# Patient Record
Sex: Female | Born: 1970 | Race: Black or African American | Hispanic: No | Marital: Married | Smoking: Never smoker
Health system: Southern US, Community
[De-identification: ages and names within clinical notes are randomized; demographics above are authoritative.]

## PROBLEM LIST (undated history)

## (undated) ENCOUNTER — Emergency Department (HOSPITAL_BASED_OUTPATIENT_CLINIC_OR_DEPARTMENT_OTHER): Admission: EM | Payer: BC Managed Care – PPO

## (undated) DIAGNOSIS — I10 Essential (primary) hypertension: Secondary | ICD-10-CM

## (undated) DIAGNOSIS — E119 Type 2 diabetes mellitus without complications: Secondary | ICD-10-CM

## (undated) HISTORY — DX: Type 2 diabetes mellitus without complications: E11.9

## (undated) HISTORY — PX: TUMOR REMOVAL: SHX12

## (undated) HISTORY — PX: AUGMENTATION MAMMAPLASTY: SUR837

## (undated) HISTORY — PX: ABDOMINAL HYSTERECTOMY: SHX81

## (undated) HISTORY — DX: Essential (primary) hypertension: I10

---

## 1998-01-18 ENCOUNTER — Emergency Department (HOSPITAL_COMMUNITY): Admission: EM | Admit: 1998-01-18 | Discharge: 1998-01-18 | Payer: Self-pay | Admitting: Emergency Medicine

## 1998-09-02 ENCOUNTER — Emergency Department (HOSPITAL_COMMUNITY): Admission: EM | Admit: 1998-09-02 | Discharge: 1998-09-02 | Payer: Self-pay | Admitting: Emergency Medicine

## 1999-02-02 ENCOUNTER — Emergency Department (HOSPITAL_COMMUNITY): Admission: EM | Admit: 1999-02-02 | Discharge: 1999-02-03 | Payer: Self-pay | Admitting: Emergency Medicine

## 1999-12-15 ENCOUNTER — Emergency Department (HOSPITAL_COMMUNITY): Admission: EM | Admit: 1999-12-15 | Discharge: 1999-12-15 | Payer: Self-pay | Admitting: Emergency Medicine

## 2000-02-22 ENCOUNTER — Inpatient Hospital Stay (HOSPITAL_COMMUNITY): Admission: AD | Admit: 2000-02-22 | Discharge: 2000-02-22 | Payer: Self-pay | Admitting: *Deleted

## 2000-05-29 ENCOUNTER — Other Ambulatory Visit: Admission: RE | Admit: 2000-05-29 | Discharge: 2000-05-29 | Payer: Self-pay | Admitting: *Deleted

## 2000-05-29 ENCOUNTER — Encounter (INDEPENDENT_AMBULATORY_CARE_PROVIDER_SITE_OTHER): Payer: Self-pay

## 2000-06-30 ENCOUNTER — Emergency Department (HOSPITAL_COMMUNITY): Admission: EM | Admit: 2000-06-30 | Discharge: 2000-06-30 | Payer: Self-pay | Admitting: Emergency Medicine

## 2000-07-18 ENCOUNTER — Emergency Department (HOSPITAL_COMMUNITY): Admission: EM | Admit: 2000-07-18 | Discharge: 2000-07-18 | Payer: Self-pay | Admitting: Emergency Medicine

## 2000-10-13 ENCOUNTER — Other Ambulatory Visit: Admission: RE | Admit: 2000-10-13 | Discharge: 2000-10-13 | Payer: Self-pay | Admitting: Obstetrics and Gynecology

## 2000-10-29 ENCOUNTER — Encounter (INDEPENDENT_AMBULATORY_CARE_PROVIDER_SITE_OTHER): Payer: Self-pay

## 2000-10-29 ENCOUNTER — Ambulatory Visit (HOSPITAL_COMMUNITY): Admission: RE | Admit: 2000-10-29 | Discharge: 2000-10-29 | Payer: Self-pay | Admitting: Obstetrics and Gynecology

## 2001-01-13 ENCOUNTER — Other Ambulatory Visit: Admission: RE | Admit: 2001-01-13 | Discharge: 2001-01-13 | Payer: Self-pay | Admitting: Obstetrics and Gynecology

## 2001-02-18 ENCOUNTER — Ambulatory Visit: Admission: RE | Admit: 2001-02-18 | Discharge: 2001-02-18 | Payer: Self-pay | Admitting: Gynecologic Oncology

## 2001-04-01 ENCOUNTER — Ambulatory Visit (HOSPITAL_COMMUNITY): Admission: RE | Admit: 2001-04-01 | Discharge: 2001-04-01 | Payer: Self-pay | Admitting: Gynecology

## 2001-04-01 ENCOUNTER — Encounter (INDEPENDENT_AMBULATORY_CARE_PROVIDER_SITE_OTHER): Payer: Self-pay | Admitting: Specialist

## 2001-04-01 ENCOUNTER — Encounter: Payer: Self-pay | Admitting: Gynecology

## 2001-04-22 ENCOUNTER — Ambulatory Visit (HOSPITAL_COMMUNITY): Admission: RE | Admit: 2001-04-22 | Discharge: 2001-04-22 | Payer: Self-pay | Admitting: Obstetrics and Gynecology

## 2001-04-22 ENCOUNTER — Encounter (INDEPENDENT_AMBULATORY_CARE_PROVIDER_SITE_OTHER): Payer: Self-pay

## 2001-05-06 ENCOUNTER — Ambulatory Visit: Admission: RE | Admit: 2001-05-06 | Discharge: 2001-05-06 | Payer: Self-pay | Admitting: Gynecology

## 2001-06-02 ENCOUNTER — Ambulatory Visit: Admission: RE | Admit: 2001-06-02 | Discharge: 2001-06-02 | Payer: Self-pay | Admitting: Gynecology

## 2001-06-16 ENCOUNTER — Encounter (INDEPENDENT_AMBULATORY_CARE_PROVIDER_SITE_OTHER): Payer: Self-pay

## 2001-06-16 ENCOUNTER — Ambulatory Visit (HOSPITAL_COMMUNITY): Admission: RE | Admit: 2001-06-16 | Discharge: 2001-06-16 | Payer: Self-pay | Admitting: Gynecology

## 2001-07-28 ENCOUNTER — Ambulatory Visit: Admission: RE | Admit: 2001-07-28 | Discharge: 2001-07-28 | Payer: Self-pay | Admitting: Gynecologic Oncology

## 2001-10-21 ENCOUNTER — Ambulatory Visit: Admission: RE | Admit: 2001-10-21 | Discharge: 2001-10-21 | Payer: Self-pay | Admitting: Gynecology

## 2001-10-21 ENCOUNTER — Other Ambulatory Visit: Admission: RE | Admit: 2001-10-21 | Discharge: 2001-10-21 | Payer: Self-pay | Admitting: Gynecology

## 2001-10-21 ENCOUNTER — Encounter (INDEPENDENT_AMBULATORY_CARE_PROVIDER_SITE_OTHER): Payer: Self-pay | Admitting: *Deleted

## 2001-11-26 ENCOUNTER — Inpatient Hospital Stay (HOSPITAL_COMMUNITY): Admission: AD | Admit: 2001-11-26 | Discharge: 2001-11-26 | Payer: Self-pay | Admitting: Obstetrics and Gynecology

## 2002-04-30 ENCOUNTER — Ambulatory Visit (HOSPITAL_COMMUNITY): Admission: RE | Admit: 2002-04-30 | Discharge: 2002-04-30 | Payer: Self-pay | Admitting: Obstetrics & Gynecology

## 2002-04-30 ENCOUNTER — Encounter: Payer: Self-pay | Admitting: Obstetrics & Gynecology

## 2002-07-13 ENCOUNTER — Inpatient Hospital Stay (HOSPITAL_COMMUNITY): Admission: AD | Admit: 2002-07-13 | Discharge: 2002-07-16 | Payer: Self-pay | Admitting: Obstetrics & Gynecology

## 2003-01-29 HISTORY — PX: AUGMENTATION MAMMAPLASTY: SUR837

## 2003-09-08 ENCOUNTER — Inpatient Hospital Stay (HOSPITAL_COMMUNITY): Admission: AD | Admit: 2003-09-08 | Discharge: 2003-09-11 | Payer: Self-pay | Admitting: Obstetrics & Gynecology

## 2003-09-09 ENCOUNTER — Encounter (INDEPENDENT_AMBULATORY_CARE_PROVIDER_SITE_OTHER): Payer: Self-pay | Admitting: Specialist

## 2003-11-30 ENCOUNTER — Inpatient Hospital Stay (HOSPITAL_COMMUNITY): Admission: RE | Admit: 2003-11-30 | Discharge: 2003-12-03 | Payer: Self-pay | Admitting: Obstetrics & Gynecology

## 2003-11-30 ENCOUNTER — Encounter (INDEPENDENT_AMBULATORY_CARE_PROVIDER_SITE_OTHER): Payer: Self-pay | Admitting: *Deleted

## 2004-04-22 ENCOUNTER — Emergency Department (HOSPITAL_COMMUNITY): Admission: EM | Admit: 2004-04-22 | Discharge: 2004-04-22 | Payer: Self-pay | Admitting: Emergency Medicine

## 2004-10-29 ENCOUNTER — Emergency Department (HOSPITAL_COMMUNITY): Admission: EM | Admit: 2004-10-29 | Discharge: 2004-10-29 | Payer: Self-pay

## 2005-06-10 ENCOUNTER — Emergency Department (HOSPITAL_COMMUNITY): Admission: EM | Admit: 2005-06-10 | Discharge: 2005-06-10 | Payer: Self-pay | Admitting: Emergency Medicine

## 2005-08-05 ENCOUNTER — Emergency Department (HOSPITAL_COMMUNITY): Admission: EM | Admit: 2005-08-05 | Discharge: 2005-08-06 | Payer: Self-pay | Admitting: Emergency Medicine

## 2005-08-30 ENCOUNTER — Emergency Department (HOSPITAL_COMMUNITY): Admission: EM | Admit: 2005-08-30 | Discharge: 2005-08-30 | Payer: Self-pay | Admitting: Family Medicine

## 2006-02-01 ENCOUNTER — Inpatient Hospital Stay (HOSPITAL_COMMUNITY): Admission: EM | Admit: 2006-02-01 | Discharge: 2006-02-05 | Payer: Self-pay | Admitting: Emergency Medicine

## 2006-02-08 ENCOUNTER — Emergency Department (HOSPITAL_COMMUNITY): Admission: EM | Admit: 2006-02-08 | Discharge: 2006-02-08 | Payer: Self-pay | Admitting: Emergency Medicine

## 2006-04-08 ENCOUNTER — Emergency Department (HOSPITAL_COMMUNITY): Admission: EM | Admit: 2006-04-08 | Discharge: 2006-04-08 | Payer: Self-pay | Admitting: Emergency Medicine

## 2007-11-03 ENCOUNTER — Other Ambulatory Visit: Admission: RE | Admit: 2007-11-03 | Discharge: 2007-11-03 | Payer: Self-pay | Admitting: Obstetrics and Gynecology

## 2007-12-01 ENCOUNTER — Other Ambulatory Visit: Admission: RE | Admit: 2007-12-01 | Discharge: 2007-12-01 | Payer: Self-pay | Admitting: Obstetrics and Gynecology

## 2008-01-14 ENCOUNTER — Emergency Department (HOSPITAL_COMMUNITY): Admission: EM | Admit: 2008-01-14 | Discharge: 2008-01-14 | Payer: Self-pay | Admitting: Emergency Medicine

## 2009-01-17 ENCOUNTER — Other Ambulatory Visit: Admission: RE | Admit: 2009-01-17 | Discharge: 2009-01-17 | Payer: Self-pay | Admitting: Obstetrics and Gynecology

## 2009-03-24 ENCOUNTER — Encounter: Admission: RE | Admit: 2009-03-24 | Discharge: 2009-03-24 | Payer: Self-pay | Admitting: Internal Medicine

## 2010-02-15 ENCOUNTER — Emergency Department (HOSPITAL_COMMUNITY)
Admission: EM | Admit: 2010-02-15 | Discharge: 2010-02-15 | Payer: Self-pay | Source: Home / Self Care | Admitting: Emergency Medicine

## 2010-03-05 ENCOUNTER — Emergency Department (HOSPITAL_COMMUNITY)
Admission: EM | Admit: 2010-03-05 | Discharge: 2010-03-05 | Disposition: A | Discharge status: Home / Self Care | Payer: No Typology Code available for payment source | Attending: Emergency Medicine | Admitting: Emergency Medicine

## 2010-03-05 ENCOUNTER — Emergency Department (HOSPITAL_COMMUNITY): Payer: No Typology Code available for payment source

## 2010-03-05 DIAGNOSIS — T148XXA Other injury of unspecified body region, initial encounter: Secondary | ICD-10-CM | POA: Insufficient documentation

## 2010-03-05 DIAGNOSIS — S335XXA Sprain of ligaments of lumbar spine, initial encounter: Secondary | ICD-10-CM | POA: Insufficient documentation

## 2010-05-04 ENCOUNTER — Emergency Department (HOSPITAL_COMMUNITY)
Admission: EM | Admit: 2010-05-04 | Discharge: 2010-05-04 | Disposition: A | Discharge status: Home / Self Care | Payer: BC Managed Care – PPO | Attending: Emergency Medicine | Admitting: Emergency Medicine

## 2010-05-04 DIAGNOSIS — R599 Enlarged lymph nodes, unspecified: Secondary | ICD-10-CM | POA: Insufficient documentation

## 2010-05-04 DIAGNOSIS — J020 Streptococcal pharyngitis: Secondary | ICD-10-CM | POA: Insufficient documentation

## 2010-05-04 LAB — RAPID STREP SCREEN (MED CTR MEBANE ONLY): Streptococcus, Group A Screen (Direct): POSITIVE — AB

## 2010-06-15 NOTE — H&P (Signed)
NAMEMarland Kitchen  Kara Hoover, Kara Hoover            ACCOUNT NO.:  [id number]   MEDICAL RECORD NO.:  [id number]          PATIENT TYPE:  INP   LOCATION:  0102                         FACILITY:  [facility]   PHYSICIAN:  Hettie Holstein, D.O.    DATE OF BIRTH:  1970/05/11   DATE OF ADMISSION:  01/31/2006  DATE OF DISCHARGE:                              HISTORY & PHYSICAL   PRIMARY CARE PHYSICIAN:  Unassigned.   CHIEF COMPLAINT:  Fever and chills.   HISTORY OF PRESENT ILLNESS:  Kara Hoover is a pleasant 40 year old  African-American female with a relatively unremarkable past medical  history who had developed cold symptoms for approximately 2 weeks. She  had been treating this like a cold; however, the morning of January 31, 2005 she noticed a temperature of 101.2 at her mother-in-law's.  Subsequently she had a recheck and it was 102. She developed some chills  and subsequently presented to the emergency department. She was  evaluated by Dr. Weldon Inches and underwent chest x-ray which was  concerning for possible abnormal finding and this was further evaluated  by CT scan which was reported as right lower lobe pneumonia (round  pneumonia). She was hypotensive, she had 1 liter of fluid and continued  to remain a little hypotensive and a little tachycardic. She is being  admitted for further treatment. She has undergone blood cultures x2  already.   PAST MEDICAL HISTORY:  Unremarkable. She has undergone a hysterectomy as  well as a conization biopsy for cervical cancer that she said had been  treated.   MEDICATIONS ON A ROUTINE BASIS:  None.   ALLERGIES:  No known drug allergies.   SOCIAL HISTORY:  She is currently employed at [employer]. She smokes less  than 1 pack of cigarettes per week. She is married and has 4 children.  Her husband, Mr. Susann Hoover, can be reached at [phone].   FAMILY HISTORY:  Noncontributory. Both of her parents are alive and well  at 74 and 62 respectively. She did have a  grandparent with prostate  cancer.   REVIEW OF SYSTEMS:  Unremarkable with the exception of the above. She  has had no nausea or vomiting or diarrhea, only shortness of breath,  fevers, chills, increased thirst otherwise further review is  unremarkable.   PHYSICAL EXAMINATION:  VITAL SIGNS:  Blood pressure 73/35, heart rate  113, respirations 16, O2 saturation 99%, temperature 99.1. She does have  a nasal cannula applied.  GENERAL:  The patient is alert, conversing. She does appear a bit  exhausted though she is not in any acute respiratory distress though her  oxygenation reveals borderline reads.  HEENT:  Reveals her head to be normocephalic, atraumatic. Extraocular  muscles intact.  NECK:  Supple, nontender, no palpable thyromegaly or mass.  CARDIOVASCULAR:  Reveals normal S1, S2 though slightly tachycardic.  LUNGS:  Reveal diminished breath sounds at the right base. There is no  overt dullness to percussion.  ABDOMEN:  Soft and nontender, no rebound or guarding.  EXTREMITIES:  Lower extremities reveal no edema.   LABORATORY DATA:  Sodium 140, potassium 3.6, BUN 13, creatinine 1.3,  glucose 106. WBC 11, hemoglobin 13.3, platelet count was 65.   CT scan as noted above revealed a right lower lobe pneumonia.   ASSESSMENT:  1. Right lower lobe pneumonia.  2. Systemic inflammatory response.  3. Dehydration.   PLAN:  We are going to admit Ms. Franklin to Step-Down ICU. Continue  aggressive IV fluids and follow clinical course. She has undergone blood  cultures and we will treat her empirically with Rocephin and Zithromax  and cover atypical infections. Will follow her clinical course and  administer pressors if necessary and required. Will follow up on basic  metabolic panel in the morning as well as a CBC and obtain urine  cultures as well.      Hettie Holstein, D.O.  Electronically Signed     ESS/MEDQ  D:  02/01/2006  T:  02/01/2006  Job:  [id number]

## 2010-06-15 NOTE — Op Note (Signed)
Kara Hoover, Kara Hoover           ACCOUNT NO.:  [id number]   MEDICAL RECORD NO.:  [id number]          PATIENT TYPE:  INP   LOCATION:  9309                          FACILITY:  [facility]   PHYSICIAN:  Roseanna Rainbow, M.D.DATE OF BIRTH:  [birth year]   DATE OF PROCEDURE:  11/30/2003  DATE OF DISCHARGE:                                 OPERATIVE REPORT   PREOPERATIVE DIAGNOSIS:  Recurrent cervical dysplasia.   POSTOPERATIVE DIAGNOSIS:  Recurrent cervical dysplasia.   PROCEDURE:  Total vaginal hysterectomy and repair of a cystotomy.   SURGEONS:  1.  Roseanna Rainbow, M.D.  2.  Charles A. Clearance Coots, M.D.   INTRAOPERATIVE CONSULTATION:  Excell Seltzer. Annabell Howells, M.D.   ANESTHESIA:  General endotracheal.   COMPLICATIONS:  Cystotomy.   ESTIMATED BLOOD LOSS:  100 cc.   FLUIDS:  As per anesthesiology.   FINDINGS:  Small anteverted uterus.  Normal tubes and ovaries.   PROCEDURE:  The patient was taken to the operating room with an IV running.  The patient was placed in the dorsal lithotomy position, prepped and draped  in the usual sterile fashion.  A weighted speculum was then placed in the  vagina and the cervix grasped with a Jacobs tenaculum.  The cervix was  injected circumferentially with 1% lidocaine with 1:200,000 of epinephrine.  The cervix was then circumferentially incised and the bladder dissected off  the pubocervical fascia anteriorly with Metzenbaum scissors.  The anterior  cul-de-sac was entered sharply.  The same procedure was performed  posteriorly and the posterior cul-de-sac entered sharply without difficulty.  At this point, parametrial clamps were placed over the uterosacral ligaments  on either side.  These were then transected and suture ligated with 0  Vicryl.  Hemostasis was ensured.  The cardinal ligaments were then clamped  on both sides, transected, and suture ligated in a similar fashion.  The  uterine arteries and broad ligaments were then serial clamped with  parametrial clamps, transected, and suture ligated on both sides.  Excellent  hemostasis was visualized.  Both cornu were clamped with parametrial clamps,  transected, and the uterus delivered.  These pedicles were secured with free  ligatures and suture ligatures.  The vaginal cuff angles were closed with  figure-of-eight sutures of 0 Vicryl.  A modified McCall culdoplasty was then  performed using 0 Vicryl.  The remainder of the vaginal cuff was closed with  figure-of-eight sutures of 0 Vicryl in an interrupted fashion.  At this  point, the Foley catheter was introduced into the bladder, and very minimal  urine was noted, and the urine that drained was blood-tinged.  At this  point, cystoscopy was performed, and a cystotomy was noted in the bladder  base below the trigone.  The right ureteral orifice was visualized.  The  left ureteral orifice was not well visualized.  At this point, the vaginal  cuff sutures were taken down.  The cystotomy was circumscribed with Allis  clamps.  The bladder laceration was then reapproximated in a running fashion  with 2-0 Monocryl.  A second imbricating layer was placed.  The closure was  tested by instilling  sterile milk into the bladder.  There was no leakage of  the sterile milk noted.  At this point, Dr. Annabell Howells was consulted.  Please see  his dictated intraoperative consultation note.  The vaginal cuff was then  reapproximated with interrupted figure-of-eight sutures.  The Foley was then  placed, and copious blue urine was noted to drain.  All instruments were  removed from the vagina and the patient taken out of dorsal lithotomy  position and awakened from general anesthesia.  The patient was taken to the  PACU in stable condition.  Sponge, lap, needle, and instrument counts were  correct x 2.   PATHOLOGY:  Uterus and cervix.     Collier Flowers  D:  12/01/2003  T:  12/02/2003  Job:  [id number]   cc:   Excell Seltzer. Annabell Howells, M.D.  [address]  [address]  [place]  [place] [address]  Fax: [phone]

## 2010-06-15 NOTE — Op Note (Signed)
[facility]  Patient:    Kara Hoover, Kara Hoover Visit Number: [id number] MRN: [id number]          Service Type: DSU Location: DAY Attending Physician:  Jeannette Corpus Dictated by:   Rudy Jew Ashley Royalty, M.D. Admit Date:  04/01/2001 Discharge Date: 04/01/2001                             Operative Report  PREOPERATIVE DIAGNOSES:       1. 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                               2. History of cervical stenosis - alleviated by                                  fractional D&C per Dr. Serita Kyle on                                  April 01, 2001.  At the time of that                                  procedure, an endocervical curettage was                                  performed which revealed fragments of highly                                  dysplastic endothelium.                               3. Desire for continued childbearing capability.  POSTOPERATIVE DIAGNOSES:      1. 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                               2. History of cervical stenosis - alleviated by  fractional D&C per Dr. Serita Kyle on                                  April 01, 2001.  At the time of that                                  procedure, an endocervical curettage was                                  performed which revealed fragments of highly                                  dysplastic endothelium.                               3. Desire for continued childbearing capability.                               4. Pathology pending.  PROCEDURE:                    Repeat cold knife  conization.  SURGEON:                      Rudy Jew. Ashley Royalty, M.D.  ANESTHESIA:                   General.  ESTIMATED BLOOD LOSS:         50 cc.  COMPLICATIONS:                None.  PACKS AND DRAINS:             None.  DESCRIPTION OF PROCEDURE:     The patient was taken to the operating room and placed in the dorsal supine position.  After general anesthesia was administered, she was placed in the lithotomy position, prepped and draped in the usual manner for vaginal surgery with Hibiclens.  Anterior and posterior retractors were placed.  The cervix was grasped with a single-tooth tenaculum at 3 and 9 oclock.  Hemostatic sutures of 2-0 chromic were placed in interrupted fashion in these locations.  Next, a lacrimal duct probe was used to verify the patency of the cervix and establish the correct axis for the cold knife conization.  A cold knife conization was performed with a #11 scalpel without difficulty.  A tall, skinny cone was felt to be indicated and thus performed.  The apex of the specimen was excised with Mayo scissors.  The specimen was marked at 12 oclock with a suture and submitted to pathology in saline for histologic studies.  Next, endocervical curettage was performed with the Kevorkian curet.  The curettings were submitted to pathology separately for histologic studies.  Next, the conization bed was coagulated with the Bovie.  Hemostasis was noted. The vaginal instruments were removed.  Hemostasis was once again noted and the procedure terminated.  The patient tolerated the procedure extremely well and was returned to the recovery room in good condition. Dictated by:   Rudy Jew Ashley Royalty, M.D. Attending Physician:  Jeannette Corpus  DD:  04/22/01 TD:  04/22/01 Job: [id number]

## 2010-06-15 NOTE — Op Note (Signed)
[facility]  Patient:    Kara Hoover, Kara Hoover Visit Number: [id number] MRN: [id number]          Service Type: DSU Location: [facility] Attending Physician:  Wandalee Ferdinand Dictated by:   Rudy Jew Ashley Royalty, M.D. Admit Date:  10/29/2000 Discharge Date: 10/29/2000                             Operative Report  PREOPERATIVE DIAGNOSES:       1. Recent Pap smear revealing "squamous cell                                  carcinoma."                               2. Recent colposcopy revealing negative                                  colposcopic directed biopsies and                                  endocervical curettage revealing high grade                                  squamous dysplasia with invasion not ruled                                  out.  POSTOPERATIVE DIAGNOSES:      1. Recent Pap smear revealing "squamous cell                                  carcinoma."                               2. Recent colposcopy revealing negative                                  colposcopic directed biopsies and                                  endocervical curettage revealing high grade                                  squamous dysplasia with invasion not ruled                                  out.  PATHOLOGY:                    Pending.  PROCEDURE:                    Cold knife conization.  SURGEON:  James A. Ashley Royalty, M.D.  ANESTHESIA:                   General.  ESTIMATED BLOOD LOSS:         50 cc.  COMPLICATIONS:                None.  PACKS AND DRAINS:             None.  PROCEDURE:                    Patient was taken to the operating room and placed in the dorsal supine position.  After adequate general anesthesia was administered she was placed in the lithotomy position and prepped and draped in the usual manner for vaginal surgery.  Posterior weighted retractor was placed per vagina.  The cervix was grasped at 3 and 9  oclock with single tooth tenaculum.  Hemostatic sutures were placed at 3 and 9 oclock with 0 Chromic.  Next, the cervix was incised in a conical fashion with a #11 blade. At the apex of the cone the cone was excised with Mayo scissors.  An incision was made at 11 oclock in the surgical specimen and the specimen pinned out on cork and submitted directly to pathology in saline.  Endocervical curettage was then performed with Kevorkian curette.  Tissue was submitted separately to pathology for histologic studies.  Four hemostatic sutures were placed 0 Chromic.  An outside-in, inside-out technique was used to secure each quadrant hemostatically.  Hemostasis was noted.  The procedure was terminated.  Patient tolerated procedure extremely well and was returned to the recovery room in good condition. Dictated by:   Rudy Jew Ashley Royalty, M.D. Attending Physician:  Wandalee Ferdinand DD:  10/31/00 TD:  11/02/00 Job: [id number]

## 2010-06-15 NOTE — Discharge Summary (Signed)
NAME:  Kara Hoover, Kara Hoover           ACCOUNT NO.:  [id number]   MEDICAL RECORD NO.:  [id number]          PATIENT TYPE:  INP   LOCATION:  1407                         FACILITY:  [facility]   PHYSICIAN:  Marcellus Scott, MD     DATE OF BIRTH:  09-11-1970   DATE OF ADMISSION:  01/31/2006  DATE OF DISCHARGE:  02/05/2006                               DISCHARGE SUMMARY   PRIMARY CARE PHYSICIAN:  Patient was unassigned when the patient was  admitted to the [facility]; however, patient on  discharge will be followed up by Dr. Lonia Blood.   DISCHARGE DIAGNOSES:  1. Right lower lobe community-acquired pneumococcal pneumonia.  2. Pneumococcal bacteremia.  3. Anemia.  4. Hypotension.  5. Tobacco abuse.   DISCHARGE MEDICATIONS:  1. Levaquin 500 mg p.o. daily x10 days.  2. Robitussin-DM 5 ml q.6h. p.r.n.  3. Nicotine transdermal 21 mg per 24-hour patch 1 daily for six weeks,      then to be tapered as an outpatient.   PROCEDURES DONE IN THE HOSPITAL:  1. On February 02, 2006, portal abdominal x-ray.  Impression is mild      colonic and small bowel distention.  2. On February 02, 2006, a chest x-ray, portable.  Impression:  Stable      right effusion and right lower lobe pneumonia.  New bibasilar      interstitial edema.  3. On February 01, 2006, portable chest x-ray.  Impression:  Worsening      right lower lobe consolidation/atelectasis with effusion.  4. On February 01, 2006, CT of the chest with contrast.  Impression:      Right lower lobe air space disease with response to the      radiographic anatomy, compatible with pneumonia.  5. On January 31, 2006, chest x-ray.  Impression is there is a large      opacity in the region of the superior segment of the right lower      lobe.  No definite air bronchograms are seen.  The medial border is      obscured.  Primary consideration would include posterior      mediastinal mass, pleural-based mass, or less likely pulmonary  parenchymal abnormality.  CT chest with contrast is warranted.   CONSULTATIONS:  None.   HOSPITAL COURSE/CONDITION OF PATIENT ON DISCHARGE:  For details of the  initial admission, please refer to the history and physical note that  was done by Dr. Hannah Beat on January 31, 2006.  In summary, [name] is a pleasant 40 year old African-American female with an  unremarkable past medical history, presented with a history of cold-like  symptoms for two weeks.  On the morning of the day of admission, she was  noted to have a high grade fever, and further evaluation revealed a  right lower lobe pneumonia.  Patient was also found to be hypotensive,  which was easily resuscitated with IV fluids.  Patient was admitted for  evaluation and management.   1. Right lower lobe pneumonia:  Patient was admitted to the step-down      unit.  Patient was  hydrated aggressively with intravenous fluids.      She was placed on intravenous ceftriaxone and Zithromax.  She also      received oxygen and Robitussin-DM.  Blood cultures were sent off,      which subsequently revealed Streptococcus pneumoniae, which was      sensitive to ceftriaxone, levofloxacin, intermediately sensitive to      penicillin.  The patient has progressively done well.  Was      transferred to the floor.  Currently, the patient is asymptomatic      of any fever, chills, or rigors.  She still has some cough with      minimal brown sputum, some dyspnea on exertion, but saturating well      without oxygen.  Feeding appropriately.  Patient was switched to      levofloxacin on the 8th of January.  She is to complete a total two      week course of antibiotics.  She is stable for discharge, to be      followed up as an outpatient by the primary care physician.  She      has been advised to seek attention immediately in the case of any      deterioration.   1. Pneumococcal bacteremia, possibly secondary to her pneumococcal      right  lower lobe pneumonia.  The patient has done well, was briefly      hypotensive in the emergency room but responded well to IV fluids      and was initially continued on ceftriaxone and Zithromax but      subsequently changed to Levaquin after obtaining the final culture      results, and she is to complete a two week course of antibiotics.      Pneumococcal vaccination to be considered as an outpatient.   1. Anemia, which is normocytic anemia:  The workup included a serum      iron of less than 10, vitamin B12 of 584, folate 7.7, ferritin of      237.  Patient has questionable iron-deficiency anemia or anemia of      chronic disease, and this has to be evaluated and managed as deemed      appropriate as an outpatient.   1. Hypotension:  This was a presentation on admission which resolved      with IV fluid hydration.  Patient did not require any pressors.      Patient's random cortisol level was 26.8, and this is probably      secondary to her sepsis secondary to right lower lobe pneumonia.   1. Tobacco use:  Patient has received tobacco cessation counseling.      She has also been placed on a nicotine transdermal patch, which has      to be tapered as an outpatient.   1. Coagulase negative staph urinary tract infection:  Patient is      sensitive to LEVOFLOXACIN, so that should cover her presumed      urinary tract infection as well.      Marcellus Scott, MD  Electronically Signed     AH/MEDQ  D:  02/05/2006  T:  02/05/2006  Job:  [id number]   cc:   Lonia Blood, M.D.

## 2010-06-15 NOTE — H&P (Signed)
NAME:  Kara Hoover, Kara Hoover           ACCOUNT NO.:  [id number]   MEDICAL RECORD NO.:  [id number]          PATIENT TYPE:  INP   LOCATION:  NA                            FACILITY:  [facility]   PHYSICIAN:  Roseanna Rainbow, M.D.DATE OF BIRTH:  1970/10/17   DATE OF ADMISSION:  DATE OF DISCHARGE:                                HISTORY & PHYSICAL   CHIEF COMPLAINT:  The patient is a 40 year old multipara with a history of  carcinoma in situ of the cervix, recurrent, who presents for a total vaginal  hysterectomy.   HISTORY OF PRESENT ILLNESS:  The patient had undergone a LEEP conization in  March 2003 for high-grade cervical dysplasia.  This was persistent with  subsequent abnormal Pap smears.  She then underwent a cold knife conization  in May 2003.  Subsequently, she developed cervical stenosis.  She was then  evaluated by Dr. Stanford Breed who then again performed a cold knife  conization.  Subsequent Pap smears in September 2003 were normal as well as  in July 2004.  However, a Pap smear from September 2005 demonstrated ASCUS,  cannot exclude high-grade squamous intraepithelial lesion.  An attempt was  made at colposcopic exam in the office.  However, the exam was  unsatisfactory.  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  The  biopsy demonstrated benign squamous mucosa with hyperkeratosis.  The  endocervical curettage was rare fragments of benign squamous mucosa.  High-  risk HPV testing was performed and the high risk types were not detected.   PAST OBSTETRICAL AND GYNECOLOGICAL HISTORY:  She is status post four NSVDs.   PAST MEDICAL HISTORY:  She denies.   PAST SURGICAL HISTORY:  See above.  She is status post a bilateral tubal  ligation.   FAMILY HISTORY:  Remarkable for cervical cancer.   SOCIAL HISTORY:  She is married.  She denies any tobacco, ethanol, or  substance abuse.   MEDICATIONS:  None.   ALLERGIES:  No known drug allergies.   PHYSICAL EXAMINATION:   VITAL SIGNS:  Blood pressure 131/80, pulse 80,  temperature 98.7, weight 155 pounds.  GENERAL:  Well-developed, well-nourished, no apparent distress.  LUNGS:  Clear to auscultation bilaterally.  HEART:  Regular rate and rhythm.  ABDOMEN:  No organomegaly, not tender.  PELVIC:  Normal EG/BUS.  On speculum exam, the vagina is clean.  On bimanual  exam, the uterus is anteverted, normal size, nontender.  The adnexa are  nonpalpable, no organomegaly, nontender bilaterally.  EXTREMITIES:  No clubbing, cyanosis, or edema.  SKIN:  Without rash.   ASSESSMENT:  History of recurrent high-grade cervical dysplasia.  Childbearing is completed.  Difficult follow-up secondary to repeat  conizations.   PLAN:  Total vaginal hysterectomy.  The risks, benefits, and alternative  forms of management were reviewed with the patient and informed consent was  obtained.      LAJ/MEDQ  D:  11/29/2003  T:  11/29/2003  Job:  [id number]

## 2010-06-15 NOTE — Consult Note (Signed)
   NAME:  Kara Hoover, Kara Hoover                     ACCOUNT NO.:  [id number]   MEDICAL RECORD NO.:  [id number]                   PATIENT TYPE:  OUT   LOCATION:  GYN                                  FACILITY:  [facility]   PHYSICIAN:  Rande Brunt. Clarke-Pearson, M.D.      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  She underwent a cold knife conization Jun 16, 2001.  She returned to see her primary gynecologist, Dr. Ashley Royalty,  recently but apparently there was great difficulty in obtaining a Pap smear.   The patient denies any gynecologic symptoms.  She has regular cyclic  menstrual periods.  She has no intramenstrual spotting.  She denies any  dyspareunia or other pelvic pain.  She remains very strongly desires of  achieving pregnancy.   PHYSICAL EXAMINATION:  VITAL SIGNS:  Weight 161 pounds.  GENERAL:  The patient is a healthy black female in no acute distress.  ABDOMEN:  Soft, nontender.  No mass, organomegaly, ascites, or hernias are  noted.  PELVIC:  EGBUS, vagina, bladder, urethra are normal.  The cervix is deviated  posteriorly, is flush with the vaginal vault, and has a narrow cervical os.  Pap smears are obtained, but with some difficulty.  Uterus is anterior,  normal shape, size, consistency.  The cervix palpates normally with no  nodularity or firmness.   IMPRESSION:  Past history of carcinoma in situ of the cervix status post  cold knife conization May 2003 with negative margins.   PLAN:  Pap smear is repeated today.   The patient had a number of questions regarding fertility which I deferred  to Dr. Ashley Royalty.  She is strongly desires of achieving fertility and I  encouraged her to discuss a fertility evaluation with Dr. Ashley Royalty.  She  should have a repeat Pap smear in four  months.                                               Daniel L. Stanford Breed, M.D.    DLC/MEDQ  D:  10/21/2001  T:  10/21/2001  Job:  [id number]   cc:   Fayrene Fearing A. Ashley Royalty, M.D.  [address]  [place], [place] [address]  Fax: [phone]   Telford Nab, R.N.

## 2010-06-15 NOTE — Consult Note (Signed)
[facility]  Patient:    Kara Hoover, Kara Hoover Visit Number: [id number] MRN: [id number]          Service Type: GON Location: GYN Attending Physician:  Jeannette Corpus Dictated by:   Rande Brunt. Clarke-Pearson, M.D. Admit Date:  02/18/2001   CC:         Fayrene Fearing A. Ashley Royalty, M.D.  Telford Nab, R.N.   Consultation Report  HISTORY OF PRESENT ILLNESS:  A 40 year old black female referred by Dr. 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  The patient, over the past year or so, has had abnormal Pap smears and ultimately underwent a cold knife conization on October 29, 2000.  Final pathology showed carcinoma in situ with extensive endocervical glandular extension and extension into the endocervical margins in multiple quadrants.  The endocervical curettage above the specimen showed multiple detached fragments of dysplastic squamous mucosa.  Subsequently, it has been found that the patient has cervical stenosis.  She does report that she has regular cyclic menses.  She has no cramping or dysmenorrhea or dyspareunia.  She is desirous of maintaining her fertility.  She does have two living children and would like to have a third by her new husband.  PAST SURGICAL HISTORY:  Cold knife conization.  DRUG ALLERGIES:  None.  CURRENT MEDICATIONS:  None.  MEDICAL ILLNESSES:  None.  REVIEW OF SYSTEMS:  Essentially negative.  PHYSICAL EXAMINATION:  VITAL SIGNS:  Weight 152 pounds, blood pressure 106/66.  GENERAL:  The patient is a healthy African American female in no acute distress.  HEENT:  Negative.  NECK:  Supple without thyromegaly.  LYMPH NODES:  There is no supraclavicular or inguinal adenopathy.  ABDOMEN:  Soft and nontender.  No masses, organomegaly, ascites, or hernias are noted.  PELVIC:  EGBUS normal.  The vagina is clean.  The cervix is apparent but the cervical os is not  apparent.  This appears to be very stenotic.  Attempts at sounding the cervix using a wire probe are unsuccessful.  Colposcopic examination is then performed using acetic acid and no lesions are noted on the external cervix, although again I am not able to identify the cervical os.  IMPRESSION:  Cervical stenosis in a patient who has had cold knife conization showing carcinoma in situ with positive margins.  Management options are discussed with the patient.  I think our options are limited to attempting to redilate the cervix under anesthesia or perform a hysterectomy.  The patient strongly desires attempting to dilate the cervix and we will therefore schedule her to undergo D&C with ultrasound guidance while she is having her menstrual period so that we may find the cervical os.  This will be scheduled for March 5 in the outpatient surgery center.  We will ask that ultrasound be available for that procedure. Dictated by:   Rande Brunt. Clarke-Pearson, M.D. Attending Physician:  Jeannette Corpus DD:  02/18/01 TD:  02/19/01 Job: [id number]

## 2010-06-15 NOTE — Op Note (Signed)
NAME:  Kara Hoover, Kara Hoover                     ACCOUNT NO.:  [id number]   MEDICAL RECORD NO.:  [id number]                   PATIENT TYPE:  INP   LOCATION:  9140                                 FACILITY:  [facility]   PHYSICIAN:  Charles A. Clearance Coots, M.D.             DATE OF BIRTH:  December 24, 1970   DATE OF PROCEDURE:  09/09/2003  DATE OF DISCHARGE:                                 OPERATIVE REPORT   PREOPERATIVE DIAGNOSES:  Desires sterilization.   POSTOPERATIVE DIAGNOSES:  Desires sterilization.   PROCEDURE:  Bilateral partial salpingectomy.   SURGEON:  Charles A. Clearance Coots, M.D.   ANESTHESIA:  Epidural.   ESTIMATED BLOOD LOSS:  Negligible.   COMPLICATIONS:  None.   SPECIMENS:  Approximately 2 cm segments of left and right fallopian tubes.   OPERATION:  The patient was brought to the operating room and after  satisfactory redosing of the epidural, the abdomen was prepped and draped in  the usual sterile fashion. A small inferior umbilical incision was made with  the scalpel that was deep and down to the fascia bluntly with curved Mayo  scissors. The fascia was grasped in the midline with Kelly forceps and was  cut transversely with curved Mayo scissors. The peritoneum was then entered  bluntly, right angled retractors were placed in the incision. The right  fallopian tube and was grasped with a Babcock clamp. The tube was followed  from the corneal end to the fimbrial end serially and grasped with Babcock  clamps and then followed retrograde back to the isthmic area of the tube.  The knuckle of tube beneath the Babcock clamp in the isthmic area of the  tube was then doubly ligated with #1 plain catgut and the section of the  tube above the knot was excised with Metzenbaum scissors and submitted to  pathology for evaluation.  There is no active bleeding from the tubal stump.  The same procedure is performed on the opposite side without complications.  The abdomen was then closed as follows.   The peritoneum and fascia was  closed as one with a continuous suture of 2-0 Vicryl. The subcutaneous  tissue was approximated with interrupted sutures of 2-0 Vicryl. The skin was  closed with continuous subcuticular suture of 4-0 Monocryl.  Sterile  bandages applied to the incision closure.  The surgical technician indicated  all sponge, needle and instrument counts were correct.  The patient  tolerated the procedure well and was transported to the recovery room in  satisfactory condition.                                               Charles A. Clearance Coots, M.D.    CAH/MEDQ  D:  09/09/2003  T:  09/09/2003  Job:  [id number]

## 2010-06-15 NOTE — Op Note (Signed)
NAMEMAIZY, DAVANZO           ACCOUNT NO.:  [id number]   MEDICAL RECORD NO.:  [id number]          PATIENT TYPE:  INP   LOCATION:  9399                          FACILITY:  [facility]   PHYSICIAN:  Excell Seltzer. Annabell Howells, M.D.    DATE OF BIRTH:  1970-08-21   DATE OF PROCEDURE:  11/30/2003  DATE OF DISCHARGE:                                 OPERATIVE REPORT   Briefly, Ms. Susann Givens is a 40 year old female who was undergoing a routine  vaginal hysterectomy. She had had two prior cervical conizations and  following completion of the procedure she was noted to have a bladder tear.  This was repaired primarily by Dr. Tamela Oddi but she wanted urologic  consultation to ensure that no additional work was necessary.   PROCEDURE:  Cystoscopy with bilateral ureteral catheterization.   SURGEON:  Excell Seltzer. Annabell Howells, M.D.   ANESTHESIA:  General.   COMPLICATIONS:  None.   INDICATIONS FOR PROCEDURE:  As above.   DESCRIPTION OF PROCEDURE:  The patient was in the lithotomy position. A 22  French cystoscope was inserted with a 12 degree lens. Examination revealed a  normal urethra, the bladder wall was smooth and pale. There was a defect  repair in the floor of the bladder between the ureteral orifices.  Monocryl  sutures from the repair were evident within the bladder lumen.  The ureteral  orifices were retracted somewhat medially so in order to ensure patency I  ordered an ampule of indigo carmine and then attempted ureteral  catheterization. A 5 French open end catheter passed approximately 1-2 cm on  the right but would not progress further. The same was noted on the left. A  Glidewire was then passed up the right ureteral orifice without difficulty.  The course of the ureter beneath the mucosa was well away from the area of  the repair.  On the left, I was unable to pass the guidewire more because of  the angulation of the orifice but she was noted to efflux blue urine on both  sides with good force and it  was felt that the ureters were not compromised.  Additionally the bladder repair seemed intact without leakage. The bladder  was then drained, the scope was removed and Dr. Tamela Oddi completed her  vaginal closure. I did encourage her to try to avoid an overlapping suture  line.  There were no complications during my portion of the procedure.      JJW/MEDQ  D:  11/30/2003  T:  11/30/2003  Job:  [id number]   cc:   Roseanna Rainbow, M.D.

## 2010-06-15 NOTE — Consult Note (Signed)
[facility]  Patient:    Kara Hoover, Kara Hoover Visit Number: [id number] MRN: [id number]          Service Type: GON Location: GYN Attending Physician:  Sabino Donovan Dictated by:   Jackquline Denmark. Kyla Balzarine, M.D. Proc. Date: 07/28/01 Admit Date:  07/28/2001 Discharge Date: 07/28/2001   CC:         Kara Hoover, M.D.  Telford Nab, R.N.   Consultation Report  REASON FOR CONSULTATION:  Kara Hoover is a 40 year old woman who returns for followup after undergoing cervical conization on 06/16/01.  The patient relates that she has not yet had a period.  She did have daily spotting and occasionally passed clots, but all bleeding has diminished since mid June.  She denies fever or chills.  She has had intercourse without difficulty.  PHYSICAL EXAMINATION:  VITAL SIGNS:  Weight 161 pounds, blood pressure 102/70.  ABDOMEN:  Soft and benign.  PELVIC:  External genitalia and BUS are normal to inspection and palpation. The bladder and urethra are well supported.  Vaginal mucosa is without lesions.  The cervix is scared post conization and will admit a small probe through the internal os.  Bimanual examination reveals small uterus without adnexal pathology.  LABORATORY DATA:  Final diagnosis on cervical conization was squamous carcinoma in situ with free margins.  Endocervical curettings revealed benign degenerating proliferative endometrium.  ASSESSMENT:  CIN 3 post conization.  PLAN:  I recommend that the patient be followed with cervical cytology at four month intervals for the first year after conization, and then at six month intervals to complete at least three to five years of followup.  She is interested in pursuing pregnancy, and this followup could be obtained under the ______ of Dr. Ashley Hoover.  We would be glad to see her back at any time on a p.r.n. basis. Dictated by:   Jackquline Denmark. Kyla Balzarine, M.D. Attending Physician:  Ronita Hipps T DD:   07/28/01 TD:  07/30/01 Job: [id number]

## 2010-06-15 NOTE — Op Note (Signed)
[facility]  Patient:    Kara Hoover, Kara Hoover Visit Number: [id number] MRN: [id number]          Service Type: DSU Location: DAY Attending Physician:  Jeannette Corpus Dictated by:   Rande Brunt. Clarke-Pearson, M.D. Proc. Date: 04/01/01 Admit Date:  04/01/2001   CC:         Fayrene Fearing A. Ashley Royalty, M.D.  Telford Nab, R.N.   Operative Report  PREOPERATIVE DIAGNOSES:  Carcinoma in situ of the cervix, cervical stenosis.  POSTOPERATIVE DIAGNOSES:  Carcinoma in situ of the cervix, cervical stenosis.  PROCEDURE:  Examination under anesthesia, dilation of the cervix, endocervical curettage.  SURGEON:  Daniel L. Clarke-Pearson, M.D.  ASSISTANT:  Telford Nab, R.N.  ANESTHESIA:  General with mask.  ESTIMATED BLOOD LOSS:  10 cc.  SURGICAL FINDINGS:  Examination under anesthesia revealed a normal-appearing cervix, although the cervical os was stenotic and flush with the upper vagina. The uterus is normal size.  With ultrasound visualization, we were able to enter the endometrial cavity safely and dilated the cervix to a #32 Pratt dilator.  DESCRIPTION OF PROCEDURE:  The patient was brought to the operating room and after satisfactory attainment of general anesthesia, was placed in lithotomy position in candy-cane stirrups.  The perineum and vagina were prepped with Betadine and the patient draped.  A Foley catheter was placed, and the bladder was filled with 200 cc of saline.  A transabdominal ultrasound was performed, identifying the uterus.  The cervix was grasped with a single-tooth tenaculum. Using a wire probe, the cervical os was identified and then sounded with visualization by ultrasound, confirming that we were in the canal and entered the endometrial cavity.  Gradually, the cervix was dilated with the [facility] dilators, again under ultrasound visualization.  Once we had achieved maximal dilation, the endocervical canal was curetted and  the specimen submitted to pathology.  Tenaculum was removed.  The patient was awakened from anesthesia and taken to the recovery room in satisfactory condition.  Sponge, needle, and instrument counts correct x 2. Dictated by:   Rande Brunt. Clarke-Pearson, M.D. Attending Physician:  Jeannette Corpus DD:  04/01/01 TD:  04/01/01 Job: [id number]

## 2010-06-15 NOTE — Op Note (Signed)
[facility]  Patient:    Kara Hoover, Kara Hoover Visit Number: [id number] MRN: [id number]          Service Type: DSU Location: DAY Attending Physician:  Jeannette Corpus Dictated by:   Rande Brunt. Clarke-Pearson, M.D. Proc. Date: 06/16/01 Admit Date:  06/16/2001   CC:         Fayrene Fearing A. Ashley Royalty, M.D.  Telford Nab, R.N.   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  POSTOPERATIVE DIAGNOSIS:  Severe dysplasia in endocervix.  PROCEDURE:  Cold knife conization and endocervical curettage.  SURGEON:  Daniel L. Clarke-Pearson, M.D.  ASSISTANT:  Telford Nab, R.N.  ANESTHESIA:  General with mask.  ESTIMATED BLOOD LOSS:  5 cc.  SURGICAL FINDINGS:  Examination under anesthesia revealed a cervix which previously had undergone prior conization and LEEP procedures. The endocervical canal was patent. The uterus sounded to approximately 6 cm posteriorly.  DESCRIPTION OF PROCEDURE:  The patient was brought to the operating room and after satisfactory attainment of general anesthesia, was placed in lithotomy position in candy-cane stirrups. The perineum and vagina were prepped with Betadine and the patient was draped. The cervix was grasped with a single-tooth tenaculum. The cervix was injected with 10 cc of 1% Xylocaine with epinephrine for hemostasis. Two sutures were placed to incorporate the cervical branch of the uterine artery at 5 and 9 oclock on the lateral cervix. These were tied for hemostasis. A long conization was then performed aiming to remove the entire endocervical canal as she had known disease high above a prior conization and LEEP procedures. The entire length of the canal, at the completion of the surgical procedure, was approximately 3.5 cm. The cervix was marked at 80 oclock with a silk suture. Endocervical curettage was then performed and submitted as a separate specimen. Hemostasis was  achieved using electrocautery incorporating the entire cone bed.  The patient was awakened from anesthesia and taken to the recovery room in satisfactory condition. Sponge, needle, and instrument counts were correct x 2. Dictated by:   Rande Brunt. Clarke-Pearson, M.D. Attending Physician:  Jeannette Corpus DD:  06/16/01 TD:  06/17/01 Job: [id number]

## 2010-06-15 NOTE — Consult Note (Signed)
[facility]  Patient:    Kara Hoover, Kara Hoover Visit Number: [id number] MRN: [id number]          Service Type: GON Location: GYN Attending Physician:  Jeannette Corpus Dictated by:   Rande Brunt. Clarke-Pearson, M.D. Proc. Date: 06/02/01 Admit Date:  06/02/2001 Discharge Date: 06/02/2001   CC:         Fayrene Fearing A. Ashley Royalty, M.D.  Telford Nab, R.N.   Consultation Report  A 40 year old African-American female returns for reevaluation.  She had a LEEP in March which showed a positive high endocervical margin and dysplastic epithelium in the endocervical curettage.  She returns today to be reassessed as we are planning another conization once she is healed.  She denies any pelvic pain/pressure, vaginal bleeding or discharge.  PHYSICAL EXAMINATION  VITAL SIGNS:  Weight 155 pounds, blood pressure 120/82.  ABDOMEN:  Soft, nontender.  No mass, organomegaly, ascites, or hernias are noted.  PELVIC:  EGBUS normal.  Vagina is clean.  Cervix is well healed following conization.  Bimanual reveals normal uterus.  Cervix is nontender and feels normal.  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 The patient remains strongly desires preserving fertility despite the fact that she has had a conization and a LEEP procedure and still has persistent disease.  She wishes to go ahead with another conization which we would aim to make as long and as high as possible in hopes of encompassing her squamous dysplasia.  The cervix is now healed and we will schedule the surgery for the near future.  The patient is aware of the increasing risks of infertility and cervical incompetence following repeated conizations.  In addition, she is aware of increased risk of bleeding on this current conization.  We will schedule her surgery for the near future. Dictated by:   Rande Brunt. Clarke-Pearson, M.D. Attending Physician:  Jeannette Corpus DD:  06/03/01 TD:  06/05/01 Job: [id number]

## 2010-06-15 NOTE — H&P (Signed)
[facility]  Patient:    Kara Hoover, Kara Hoover Visit Number: [id number] MRN: [id number]          Service Type: DSU Location: [facility] Attending Physician:  Wandalee Ferdinand Dictated by:   Rudy Jew Ashley Royalty, M.D. Admit Date:  10/29/2000 Discharge Date: 10/29/2000                           History and Physical  HISTORY OF PRESENT ILLNESS:   This is a 40 year old, gravida 2, para 2 who presented to my office October 13, 2000, for annual examination after the death of her previous physician. She states she had a cryosurgery of the cervix performed sometime around June 2002. Pap smear performed on October 13, 2000, revealed squamous cell carcinoma. The patient presented for colposcopy on October 23, 2000. Colposcopically directed biopsies were negative for any dysplasia. However, the endocervical curettage revealed fragments of high-grade squamous dysplasia. The pathologist specifically stated that he could not rule out the possibility of invasive disease. Hence, the patient is for cold knife conization for diagnosis, as well as therapy.  CURRENT MEDICATIONS:          None.  PAST MEDICAL HISTORY:         Negative.  PAST SURGICAL HISTORY:        As above.  ALLERGIES:                    None.  FAMILY HISTORY:               Positive for colon cancer.  SOCIAL HISTORY:               The patient smokes one half pack of cigarettes per day. She denies the use of alcohol.  REVIEW OF SYSTEMS:            Noncontributory.  PHYSICAL EXAMINATION:  GENERAL:                      Well-developed, well-nourished, pleasant, black female in no acute distress.  VITAL SIGNS:                  Afebrile; vital signs stable.  SKIN:                         Warm and dry without lesions.  LYMPH:                        There is no supraclavicular, cervical, or inguinal adenopathy.  HEENT:                        Normocephalic.  NECK:                          Supple without thyromegaly.  CHEST:                        Lungs are clear.  HEART:                        Regular rate and rhythm without murmurs, rubs, or gallops.  BREASTS:                      Deferred.  ABDOMEN:  Soft, nontender without masses or organomegaly. Bowel sounds are active.  MUSCULOSKELETAL:              Full range of motion without edema, cyanosis, or CVA tenderness.  PELVIC:                       Deferred until examination under anesthesia.  IMPRESSION:                   1. Recent Pap smear revealing "squamous                                  cell carcinoma."                               2. Recent colposcopy performed at which time                                  colposcopic directed biopsies were negative,                                  but endocervical curettage revealed fragments                                  of high-grade squamous dysplasia with                                  invasion not ruled out.  PLAN:                         Cold knife conization. Risks, benefits, complications, and alternatives fully discussed with the patient. She states she understands and accepts. Questions were invited and answered. Dictated by:   Rudy Jew Ashley Royalty, M.D. Attending Physician:  Wandalee Ferdinand DD:  10/31/00 TD:  10/31/00 Job: [id number]

## 2010-06-15 NOTE — H&P (Signed)
[facility]  Patient:    Kara Hoover, Kara Hoover Visit Number: [id number] MRN: [id number]          Service Type: DSU Location: DAY Attending Physician:  Jeannette Corpus Dictated by:   Rudy Jew Ashley Royalty, M.D. Admit Date:  04/01/2001 Discharge Date: 04/01/2001                           History and Physical  HISTORY:                      This is a 40 year old gravida 2, para 2 who has a history of high grade cervical dysplasia after having cryo surgery performed in the vicinity of June 2002 by another physician.  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  Endocervical curettage at that time revealed multiple detached fragments of dysplastic squamous mucosa.  No invasion was identified.  I subsequently discussed the case with Dr. Ronita Hipps (GYN oncologist). Patient was subsequently asked to return every three months for repeat Pap smears for one year.  I told her we would need to do an endocervical curettage at approximately three month intervals as well.  If any were abnormal we would have to address the problem with repeat conization as the patient desires to have additional childbearing capabilities.  Patient presented January 12, 2001 for repeat Pap.  At that time she was noted to be markedly stenotic.  She was subsequently referred to Dr. Stanford Breed and Dr. Kyla Balzarine for further evaluation and treatment.  Dr. Stanford Breed took her to the operating room, I believe, in February 2003 and performed a fractional dilatation and curettage with ultrasound guidance. Patency of the cervix was once again established.  The fractional D&C revealed high grade dysplasia.  Patient is hence for repeat cold knife conization.  MEDICATIONS:                  None.  PAST MEDICAL HISTORY:          Negative.  PAST SURGICAL HISTORY:        Cervical cryo surgery as mentioned above.  ALLERGIES:                    None.  FAMILY HISTORY:               Positive for colon cancer.  SOCIAL HISTORY:               Patient smokes approximately one-half pack of cigarettes per day.  REVIEW OF SYSTEMS:            Noncontributory.  PHYSICAL EXAMINATION  GENERAL:                      Well-developed, well-nourished, pleasant black female in no acute distress.  VITAL SIGNS:                  Afebrile.  Vital signs stable.  SKIN:                         Warm and dry without lesions.  LYMPH:                        There is no supraclavicular, cervical, or inguinal adenopathy.  HEENT:                        Normocephalic.  NECK:                         Supple without thyromegaly.  CHEST:                        Lungs are clear.  CARDIAC:                      Regular rate and rhythm without murmur, rub, or gallop.  BREASTS:                      Deferred.  ABDOMEN:                      Soft and nontender without masses or organomegaly.  Bowel sounds are active.  MUSCULOSKELETAL:              Full range of motion without edema, cyanosis, or CVA tenderness.  PELVIC:                       Deferred until examination under anesthesia.  IMPRESSION:                   1. Status post cold knife conization for high                                  grade dysplasia after cryo surgery performed                                  elsewhere.                               2. Carcinoma in situ on the conization specimen                                  of October 29, 2000 with extensive                                  endocervical glandular extension and                                  extension to the endocervical margins in                                  multiple quadrants.                               3. Status post cervical stenosis with dilatation                                  and  curettage performed by Dr. Jesusita Oka  Clarke-Pearson for relief.  Endocervical                                  curettage at that time revealed fragments of                                  highly dysplastic epithelium.  PLAN:                         Cold knife conization, repeat.  Risks, benefits, complications, and alternatives fully discussed with the patient.  She states she understands and accepts.  Questions invited and answered. Dictated by:   Rudy Jew Ashley Royalty, M.D. Attending Physician:  Jeannette Corpus DD:  04/22/01 TD:  04/22/01 Job: [id number]

## 2010-06-15 NOTE — H&P (Signed)
   NAME:  Kara Hoover, Kara Hoover                     ACCOUNT NO.:  [id number]   MEDICAL RECORD NO.:  [id number]                   PATIENT TYPE:  INP   LOCATION:  9142                                 FACILITY:  [facility]   PHYSICIAN:  Roseanna Rainbow, M.D.         DATE OF BIRTH:  1970-08-16   DATE OF ADMISSION:  07/13/2002  DATE OF DISCHARGE:  07/16/2002                                HISTORY & PHYSICAL   HISTORY OF PRESENT ILLNESS:  The patient is a 40 year old para 2 with  estimated date of confinement of July 12, 2002 who presents complaining of  uterine contractions.   ANTEPARTUM COURSE RISK FACTORS/PROBLEMS:  The patient has a history of 3  cold knife conizations followed by Dr. De Blanch for cervical  dysplasia. She also has a history of preterm labor and a preterm delivery.  Her blood type is Rh negative.  Elevated 1-hour Glucola of 158 with a normal  3-hour glucose tolerance test. Asymptomatic bacteria.   ANTEPARTUM LABORATORY DATA:  Hemoglobin 12.4, hematocrit 37.1, platelets  187,000. Blood type B negative, Rh antibody negative, sickle cell trait  negative, RPR non-reactive, rubella immune, hepatitis B surface antigen  negative, HIV nonreactive, gonorrhea negative, Chlamydia negative. GBS  negative. Triple screen within normal limits.   PAST OBSTETRIC/GYNECOLOGICAL HISTORY:  As above. In April of 1986, she  delivered a female 3 pound 2-1/2 ounces at 7 months vaginal delivery,  complicated by preterm labor. In April of 1990, she delivered a female 8  pounds, full term, vaginal delivery, no complications.   PAST MEDICAL HISTORY:  She denies.   PAST SURGICAL HISTORY:  As above.   FAMILY HISTORY:  Noncontributory.   SOCIAL HISTORY:  No tobacco, ethanol, or substance abuse.   ALLERGIES:  No known drug allergies.   MEDICATIONS:  Prenatal vitamins and Macrobid 1 tab p.o. b.i.d.   PHYSICAL EXAMINATION:  VITAL SIGNS: Stable, afebrile. Fetal heart tracing  reassuring.  Uterine tocodynamometer revealed regular uterine contractions.  GU: Sterile vaginal examination, cervix 2-3 cm dilated, 80% effaced, bulging  bag of water.  EXTREMITIES: Nontender.  ABDOMEN: Gravid.    ASSESSMENT:  Multipara at term with early labor, fetal heart tracing  consistent with fetal well being.   PLAN:  Admission. Monitor progress.                                               Roseanna Rainbow, M.D.    Judee Clara  D:  08/14/2002  T:  08/15/2002  Job:  [id number]

## 2010-06-15 NOTE — Consult Note (Signed)
[facility]  Patient:    Kara Hoover, CADOTTE Visit Number: [id number] MRN: [id number]          Service Type: GON Location: GYN Attending Physician:  Jeannette Corpus Dictated by:   Rande Brunt. Clarke-Pearson, M.D. Proc. Date: 05/06/01 Admit Date:  05/06/2001   CC:         Fayrene Fearing A. Ashley Royalty, M.D.  Telford Nab, R.N.   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  The external cone specimen and the "top hat" in the cervical specimen showed no abnormality.  However, the endocervical curettage showed a rare detached fragment of markedly dysplastic epithelium.  DISCUSSION/PLAN:  I discussed this report with the patient.  It appears that above the area where the LEEP was performed, there remains some severe dysplasia.  The patient has previously had a cold knife conization.  She is strongly desirous of preserving fertility.  I indicated to her that I felt that standard of care at this juncture would be to proceed with a hysterectomy for definitive treatment.  However, if wishes to preserve fertility, another conization would be recommended.  This would clearly have to be a long high conization and she is informed it will significantly reduce the cervical volume which might lead to cervical incompetence, cervical stenosis, and possible infertility.  In addition, the procedure would be more risky with an increased risk of bleeding the higher we proceed up the cervical canal.  The patient is strongly desirous of having a conization rather than a hysterectomy.  I indicated that we could proceed with conization but would prefer to await her to have complete healing of her cervix.  She will therefore return to see [place] in six weeks for followup and at that time we  can determine a date for a conization. Dictated by:   Rande Brunt. Clarke-Pearson, M.D. Attending Physician:  Jeannette Corpus DD:  05/06/01 TD:  05/06/01 Job: [id number]

## 2010-06-15 NOTE — Discharge Summary (Signed)
NAMEAVERYANA, PILLARS           ACCOUNT NO.:  [id number]   MEDICAL RECORD NO.:  [id number]          PATIENT TYPE:  INP   LOCATION:  9309                          FACILITY:  [facility]   PHYSICIAN:  Roseanna Rainbow, M.D.DATE OF BIRTH:  06-30-1970   DATE OF ADMISSION:  11/30/2003  DATE OF DISCHARGE:  12/03/2003                                 DISCHARGE SUMMARY   CHIEF COMPLAINT:  The patient is a 40 year old with a history of carcinoma  in situ of the cervix, recurrent, who presents for a total vaginal  hysterectomy.  Please see the dictated History and Physical for further  details.   HOSPITAL COURSE:  The patient was admitted and underwent a total vaginal  hysterectomy with the repair of a cystotomy.  Please see the dictated  operative summary as per Dr. Tamela Oddi and Dr. Bjorn Pippin for further  details. The patient's postoperative course was uneventful.  She was  discharged to home on postoperative day #3 tolerating a regular diet.   DISCHARGE DIAGNOSIS:  History of recurrent cervical dysplasia.   PROCEDURE:  Total vaginal hysterectomy with repair of a cystotomy.   CONDITION:  Stable.   DIET:  Regular.   ACTIVITY:  No strenuous activity, pelvic rest.   SPECIAL INSTRUCTIONS:  The patient was sent home with a Foley catheter in  situ and she was given leg bag training.   MEDICATIONS:  Percocet.   DISPOSITION:  The patient was to follow up in the office in 1 week.     Collier Flowers  D:  12/30/2003  T:  12/30/2003  Job:  [id number]   cc:   Excell Seltzer. Annabell Howells, M.D.  [address]  [place]  [place] [address]  Fax: [phone]

## 2011-01-10 ENCOUNTER — Emergency Department (INDEPENDENT_AMBULATORY_CARE_PROVIDER_SITE_OTHER): Payer: No Typology Code available for payment source

## 2011-01-10 ENCOUNTER — Encounter: Payer: Self-pay | Admitting: Emergency Medicine

## 2011-01-10 ENCOUNTER — Emergency Department (INDEPENDENT_AMBULATORY_CARE_PROVIDER_SITE_OTHER)
Admission: EM | Admit: 2011-01-10 | Discharge: 2011-01-10 | Disposition: A | Discharge status: Home / Self Care | Payer: BC Managed Care – PPO | Source: Home / Self Care | Attending: Family Medicine | Admitting: Family Medicine

## 2011-01-10 DIAGNOSIS — S83419A Sprain of medial collateral ligament of unspecified knee, initial encounter: Secondary | ICD-10-CM

## 2011-01-10 DIAGNOSIS — S83411A Sprain of medial collateral ligament of right knee, initial encounter: Secondary | ICD-10-CM

## 2011-01-10 MED ORDER — HYDROCODONE-ACETAMINOPHEN 5-325 MG PO TABS: ORAL_TABLET | ORAL | Status: AC

## 2011-01-10 MED ORDER — NAPROXEN 375 MG PO TABS: 375.0000 mg | ORAL_TABLET | Freq: Two times a day (BID) | ORAL | Status: AC

## 2011-01-10 NOTE — ED Provider Notes (Signed)
History     CSN: [id number] Arrival date & time: 01/10/2011  9:41 AM   First MD Initiated Contact with Patient 01/10/11 [phone]      Chief Complaint  Patient presents with  . Knee Pain    (Consider location/radiation/quality/duration/timing/severity/associated sxs/prior treatment) HPI Comments: Kara Hoover presents for evaluation of RIGHT knee pain that started several weeks ago when her leg "just gave out on her." She denies any injury. No twisting. But she did fall again recently. Now pain with walking.   Patient is a 40 y.o. female presenting with knee pain. The history is provided by the patient.  Knee Pain This is a new problem. The problem occurs constantly. The problem has not changed since onset.The symptoms are aggravated by walking, exertion, standing, bending and twisting. The symptoms are relieved by nothing.    History reviewed. No pertinent past medical history.  Past Surgical History  Procedure Date  . Abdominal hysterectomy     History reviewed. No pertinent family history.  History  Substance Use Topics  . Smoking status: Never Smoker   . Smokeless tobacco: Not on file  . Alcohol Use: No    OB History    Grav Para Term Preterm Abortions TAB SAB Ect Mult Living                  Review of Systems  Constitutional: Negative.   HENT: Negative.   Eyes: Negative.   Respiratory: Negative.   Cardiovascular: Negative.   Gastrointestinal: Negative.   Genitourinary: Negative.   Musculoskeletal: Positive for arthralgias and gait problem.       RIGHT knee pain  Skin: Negative.   Neurological: Negative.     Allergies  Review of patient's allergies indicates not on file.  Home Medications  No current outpatient prescriptions on file.  BP 114/67  Pulse 67  Temp(Src) 98.4 F (36.9 C) (Oral)  Resp 18  SpO2 100%  Physical Exam  Nursing note and vitals reviewed. Constitutional: She is oriented to person, place, and time. She appears well-developed and  well-nourished.  HENT:  Head: Normocephalic and atraumatic.  Eyes: EOM are normal.  Neck: Normal range of motion.  Pulmonary/Chest: Effort normal.  Musculoskeletal: Normal range of motion.       Right knee: She exhibits normal range of motion, no swelling, no effusion, normal patellar mobility, normal meniscus and no MCL laxity. tenderness found. Medial joint line and MCL tenderness noted. No LCL tenderness noted.       Legs: Neurological: She is alert and oriented to person, place, and time.  Skin: Skin is warm and dry.  Psychiatric: Her behavior is normal.    ED Course  Procedures (including critical care time)  Labs Reviewed - No data to display No results found.   No diagnosis found.    MDM  RIGHT knee x-ray: no acute findings; reviewed by radiologist and myself        Richardo Priest, MD 01/10/11 [phone]

## 2011-01-10 NOTE — ED Notes (Signed)
Pt here with right knee pain that started last Friday.pt states she was walking toward her father when her r leg buckled on  Her then when she was at home in b/[place] it happened again but she fell directly on knee.no hx injury or arthritis.sx sharp,intermitt pain that worsens with ambulation.pt is limping at this time.

## 2012-01-12 ENCOUNTER — Encounter (HOSPITAL_COMMUNITY): Payer: Self-pay | Admitting: Nurse Practitioner

## 2012-01-12 ENCOUNTER — Emergency Department (HOSPITAL_COMMUNITY)
Admission: EM | Admit: 2012-01-12 | Discharge: 2012-01-12 | Disposition: A | Discharge status: Home / Self Care | Payer: BC Managed Care – PPO | Attending: Emergency Medicine | Admitting: Emergency Medicine

## 2012-01-12 ENCOUNTER — Emergency Department (HOSPITAL_COMMUNITY): Payer: BC Managed Care – PPO

## 2012-01-12 DIAGNOSIS — R209 Unspecified disturbances of skin sensation: Secondary | ICD-10-CM | POA: Insufficient documentation

## 2012-01-12 DIAGNOSIS — Z3202 Encounter for pregnancy test, result negative: Secondary | ICD-10-CM | POA: Insufficient documentation

## 2012-01-12 DIAGNOSIS — R1011 Right upper quadrant pain: Secondary | ICD-10-CM | POA: Insufficient documentation

## 2012-01-12 DIAGNOSIS — Z90710 Acquired absence of both cervix and uterus: Secondary | ICD-10-CM | POA: Insufficient documentation

## 2012-01-12 LAB — URINALYSIS, MICROSCOPIC ONLY
Glucose, UA: NEGATIVE mg/dL
Nitrite: NEGATIVE
Specific Gravity, Urine: 1.026 (ref 1.005–1.030)

## 2012-01-12 LAB — CBC WITH DIFFERENTIAL/PLATELET
Eosinophils Relative: 1 % (ref 0–5)
HCT: 40.7 % (ref 36.0–46.0)
Lymphocytes Relative: 26 % (ref 12–46)
Neutrophils Relative %: 64 % (ref 43–77)
WBC: 6.2 10*3/uL (ref 4.0–10.5)

## 2012-01-12 LAB — COMPREHENSIVE METABOLIC PANEL
AST: 16 U/L (ref 0–37)
BUN: 11 mg/dL (ref 6–23)
CO2: 24 mEq/L (ref 19–32)
Calcium: 9.3 mg/dL (ref 8.4–10.5)
Chloride: 106 mEq/L (ref 96–112)
Creatinine, Ser: 0.98 mg/dL (ref 0.50–1.10)
GFR calc Af Amer: 81 mL/min — ABNORMAL LOW (ref >90)
GFR calc non Af Amer: 70 mL/min — ABNORMAL LOW (ref >90)
Glucose, Bld: 105 mg/dL — ABNORMAL HIGH (ref 70–99)
Sodium: 141 mEq/L (ref 135–145)
Total Bilirubin: 0.3 mg/dL (ref 0.3–1.2)

## 2012-01-12 LAB — POCT PREGNANCY, URINE: Preg Test, Ur: NEGATIVE

## 2012-01-12 LAB — LIPASE, BLOOD: Lipase: 25 U/L (ref 11–59)

## 2012-01-12 MED ORDER — FAMOTIDINE 20 MG PO TABS: 20.0000 mg | ORAL_TABLET | Freq: Two times a day (BID) | ORAL | Status: DC

## 2012-01-12 MED ORDER — HYDROCODONE-ACETAMINOPHEN 5-325 MG PO TABS: 1.0000 | ORAL_TABLET | Freq: Four times a day (QID) | ORAL | Status: DC | PRN

## 2012-01-12 NOTE — ED Notes (Signed)
Pt c/o R sided flank and abd pain "for a while" but feeling worse today. Reports diarrhea x 2 last night relieved with pepto bismol.

## 2012-01-12 NOTE — ED Provider Notes (Signed)
Medical screening examination/treatment/procedure(s) were performed by non-physician practitioner and as supervising physician I was immediately available for consultation/collaboration.   Joya Gaskins, MD 01/12/12 1540

## 2012-01-12 NOTE — ED Notes (Signed)
rt back pain and warm tingling feeling and rt upper abd pain tighting feelin and then releases off and on over a couple of months  Got bad on friday

## 2012-01-12 NOTE — ED Provider Notes (Signed)
History     CSN: [id number]  Arrival date & time 01/12/12  1145   First MD Initiated Contact with Patient 01/12/12 1205      Chief Complaint  Patient presents with  . Flank Pain    (Consider location/radiation/quality/duration/timing/severity/associated sxs/prior treatment) HPI The patient presents with RUQ pain on Friday night.  She reports several episodes of RUQ pain after drinking a glass of wine.  The discomfort is described as sever and lasts for 3-5 minutes before relief.  The pain does not radiate to the back, shoulder, groin.She reports 2 episodes of diarrhea in the past 24 hours relieved by Pepto bismol. She denies nausea, chest pain, shortness of breath, fever, headache,  vomiting, fever or chills, constipation, dark or bloody stools.  The patient also complains of intermitant L sided Flank back pain. And reports tingling to lower back.  Denies trauma to area. Denies loss of bowel or bladder incontinence.   History reviewed. No pertinent past medical history.  Past Surgical History  Procedure Date  . Abdominal hysterectomy     History reviewed. No pertinent family history.  History  Substance Use Topics  . Smoking status: Never Smoker   . Smokeless tobacco: Not on file  . Alcohol Use: Yes    OB History    Grav Para Term Preterm Abortions TAB SAB Ect Mult Living                  Review of Systems All other systems negative except as documented in the HPI. All pertinent positives and negatives as reviewed in the HPI.  Allergies  Review of patient's allergies indicates no known allergies.  Home Medications   Current Outpatient Rx  Name  Route  Sig  Dispense  Refill  . BISMUTH SUBSALICYLATE 262 MG PO CHEW   Oral   Chew 262 mg by mouth daily as needed. For stomach pain         . HYDROCODONE-ACETAMINOPHEN 5-325 MG PO TABS   Oral   Take 1 tablet by mouth every 6 (six) hours as needed. For pain           BP 127/87  Pulse 82  Temp 98.5 F (36.9  C) (Oral)  Resp 15  SpO2 97%  Physical Exam  Nursing note and vitals reviewed. Constitutional: She appears well-developed and well-nourished. She is cooperative. She does not appear ill. No distress.  Cardiovascular: Normal rate, regular rhythm, S1 normal, S2 normal and normal heart sounds.   Pulmonary/Chest: Effort normal. No respiratory distress. She has no decreased breath sounds. She has no wheezes. She has no rhonchi. She has no rales.  Abdominal: Soft. Normal appearance and bowel sounds are normal. There is tenderness in the right upper quadrant. There is no rigidity, no rebound, no guarding, no CVA tenderness and negative Murphy's sign.    Musculoskeletal:       Lumbar back: She exhibits normal range of motion, no tenderness and no spasm.  Neurological: She is alert.  Skin: Skin is warm and dry.    ED Course  Procedures (including critical care time)  Labs Reviewed  COMPREHENSIVE METABOLIC PANEL - Abnormal; Notable for the following:    Glucose, Bld 105 (*)     GFR calc non Af Amer 70 (*)     GFR calc Af Amer 81 (*)     All other components within normal limits  URINALYSIS, MICROSCOPIC ONLY - Abnormal; Notable for the following:    APPearance CLOUDY (*)  Hgb urine dipstick MODERATE (*)     Squamous Epithelial / LPF FEW (*)     All other components within normal limits  CBC WITH DIFFERENTIAL  LIPASE, BLOOD  POCT PREGNANCY, URINE   Patient, is advised of her results and the need to followup with the GI.  She is advised return here for any worsening in her condition.  I advised her also increase her fluid intake as much possible.   MDM  MDM Reviewed: nursing note and vitals Interpretation: labs and ultrasound             Carlyle Dolly, PA-C 01/12/12 1500

## 2013-06-09 ENCOUNTER — Ambulatory Visit: Payer: BC Managed Care – PPO | Admitting: Family Medicine

## 2013-06-09 VITALS — BP 128/80 | HR 62 | Temp 98.7°F | Resp 17 | Ht 65.5 in | Wt 168.0 lb

## 2013-06-09 DIAGNOSIS — M7989 Other specified soft tissue disorders: Secondary | ICD-10-CM

## 2013-06-09 DIAGNOSIS — R609 Edema, unspecified: Secondary | ICD-10-CM

## 2013-06-09 DIAGNOSIS — R600 Localized edema: Secondary | ICD-10-CM

## 2013-06-09 LAB — COMPLETE METABOLIC PANEL WITHOUT GFR
AST: 16 U/L (ref 0–37)
Alkaline Phosphatase: 64 U/L (ref 39–117)
GFR, Est Non African American: 74 mL/min
Glucose, Bld: 95 mg/dL (ref 70–99)
Potassium: 4.5 meq/L (ref 3.5–5.3)
Sodium: 144 meq/L (ref 135–145)
Total Bilirubin: 0.3 mg/dL (ref 0.2–1.2)
Total Protein: 5.7 g/dL — ABNORMAL LOW (ref 6.0–8.3)

## 2013-06-09 LAB — COMPLETE METABOLIC PANEL WITH GFR
ALT: 18 U/L (ref 0–35)
Albumin: 3.7 g/dL (ref 3.5–5.2)
BUN: 11 mg/dL (ref 6–23)
CO2: 25 mEq/L (ref 19–32)
Calcium: 8.9 mg/dL (ref 8.4–10.5)
Chloride: 110 mEq/L (ref 96–112)
Creat: 0.94 mg/dL (ref 0.50–1.10)
GFR, Est African American: 85 mL/min

## 2013-06-09 LAB — POCT URINALYSIS DIPSTICK
Bilirubin, UA: NEGATIVE
Glucose, UA: NEGATIVE
Ketones, UA: NEGATIVE
Leukocytes, UA: NEGATIVE
Nitrite, UA: NEGATIVE
Protein, UA: NEGATIVE
Spec Grav, UA: 1.02
Urobilinogen, UA: 0.2
pH, UA: 7

## 2013-06-09 LAB — POCT CBC
Granulocyte percent: 70.2 % (ref 37–80)
HCT, POC: 41.9 % (ref 37.7–47.9)
Hemoglobin: 13.6 g/dL (ref 12.2–16.2)
Lymph, poc: 2 (ref 0.6–3.4)
MCH, POC: 31.9 pg — AB (ref 27–31.2)
MCHC: 32.5 g/dL (ref 31.8–35.4)
MCV: 98.4 fL — AB (ref 80–97)
MID (cbc): 0.5 (ref 0–0.9)
MPV: 8.6 fL (ref 0–99.8)
POC Granulocyte: 5.7 (ref 2–6.9)
POC LYMPH PERCENT: 24.2 % (ref 10–50)
POC MID %: 5.6 % (ref 0–12)
Platelet Count, POC: 220 10*3/uL (ref 142–424)
RBC: 4.26 M/uL (ref 4.04–5.48)
RDW, POC: 13.5 %
WBC: 8.1 10*3/uL (ref 4.6–10.2)

## 2013-06-09 LAB — POCT UA - MICROSCOPIC ONLY
Bacteria, U Microscopic: NEGATIVE
Casts, Ur, LPF, POC: NEGATIVE
Crystals, Ur, HPF, POC: NEGATIVE
Yeast, UA: NEGATIVE

## 2013-06-09 NOTE — Progress Notes (Signed)
Chief Complaint:  Chief Complaint  Patient presents with  . Foot Swelling  . Joint Swelling    HPI: Kara Hoover is a 43 y.o. female who is here for  3 day history of leg swelling, she has never had this problem She does not have SOB/DOE/PND, chest pain.  Making urine No CHF in history has been dieting an dnot eating out to much.  No past medical history on file. Past Surgical History  Procedure Laterality Date  . Abdominal hysterectomy     History   Social History  . Marital Status: Married    Spouse Name: N/A    Number of Children: N/A  . Years of Education: N/A   Social History Main Topics  . Smoking status: Never Smoker   . Smokeless tobacco: None  . Alcohol Use: Yes  . Drug Use: No  . Sexual Activity:    Other Topics Concern  . None   Social History Narrative  . None   No family history on file. No Known Allergies Prior to Admission medications   Not on File     ROS: The patient denies fevers, chills, night sweats, unintentional weight loss, chest pain, palpitations, wheezing, dyspnea on exertion, nausea, vomiting, abdominal pain, dysuria, hematuria, melena,  weakness, . + numbenss   All other systems have been reviewed and were otherwise negative with the exception of those mentioned in the HPI and as above.    PHYSICAL EXAM: Filed Vitals:   06/09/13 1148  BP: 128/80  Pulse: 62  Temp: 98.7 F (37.1 C)  Resp: 17   Filed Vitals:   06/09/13 1148  Height: 5' 5.5" (1.664 m)  Weight: 168 lb (76.204 kg)   Body mass index is 27.52 kg/(m^2).  General: Alert, no acute distress HEENT:  Normocephalic, atraumatic, oropharynx patent. EOMI, PERRLA Cardiovascular:  Regular rate and rhythm, no rubs murmurs or gallops.  No Carotid bruits, radial pulse intact. + bilateral pedal edema.  Respiratory: Clear to auscultation bilaterally.  No wheezes, rales, or rhonchi.  No cyanosis, no use of accessory musculature GI: No organomegaly, abdomen is  soft and non-tender, positive bowel sounds.  No masses. Skin: No rashes. Neurologic: Facial musculature symmetric. Psychiatric: Patient is appropriate throughout our interaction. Lymphatic: No cervical lymphadenopathy Musculoskeletal: Gait intact.   LABS: Results for orders placed in visit on 06/09/13  POCT UA - MICROSCOPIC ONLY      Result Value Ref Range   WBC, Ur, HPF, POC 0-1     RBC, urine, microscopic 0-1     Bacteria, U Microscopic neg     Mucus, UA trace     Epithelial cells, urine per micros 0-2     Crystals, Ur, HPF, POC neg     Casts, Ur, LPF, POC neg     Yeast, UA neg    POCT URINALYSIS DIPSTICK      Result Value Ref Range   Color, UA yellow     Clarity, UA clear     Glucose, UA neg     Bilirubin, UA neg     Ketones, UA neg     Spec Grav, UA 1.020     Blood, UA trace     pH, UA 7.0     Protein, UA neg     Urobilinogen, UA 0.2     Nitrite, UA neg     Leukocytes, UA Negative    POCT CBC      Result Value Ref Range  WBC 8.1  4.6 - 10.2 K/uL   Lymph, poc 2.0  0.6 - 3.4   POC LYMPH PERCENT 24.2  10 - 50 %L   MID (cbc) 0.5  0 - 0.9   POC MID % 5.6  0 - 12 %M   POC Granulocyte 5.7  2 - 6.9   Granulocyte percent 70.2  37 - 80 %G   RBC 4.26  4.04 - 5.48 M/uL   Hemoglobin 13.6  12.2 - 16.2 g/dL   HCT, POC 16.141.9  09.637.7 - 47.9 %   MCV 98.4 (*) 80 - 97 fL   MCH, POC 31.9 (*) 27 - 31.2 pg   MCHC 32.5  31.8 - 35.4 g/dL   RDW, POC 04.513.5     Platelet Count, POC 220  142 - 424 K/uL   MPV 8.6  0 - 99.8 fL     EKG/XRAY:   [facility]   ASSESSMENT/PLAN: Encounter Diagnoses  Name Primary?  . Pedal edema Yes  . Leg swelling    Labs pending Compression stockings Wil consider calling in lasix  Will call with labs F/u prn  Gross sideeffects, risk and benefits, and alternatives of medications d/w patient. Patient is aware that all medications have potential sideeffects and we are unable to predict every sideeffect or drug-drug  interaction that may occur.  Lenell Antuhao P Bailea Beed, DO 06/09/2013 1:29 PM

## 2013-06-10 ENCOUNTER — Other Ambulatory Visit: Payer: Self-pay | Admitting: Family Medicine

## 2013-06-10 MED ORDER — FUROSEMIDE 20 MG PO TABS: 20.0000 mg | ORAL_TABLET | Freq: Every day | ORAL | Status: DC | PRN

## 2013-07-02 ENCOUNTER — Other Ambulatory Visit: Payer: Self-pay | Admitting: Obstetrics and Gynecology

## 2013-07-02 ENCOUNTER — Other Ambulatory Visit (HOSPITAL_COMMUNITY)
Admission: RE | Admit: 2013-07-02 | Discharge: 2013-07-02 | Disposition: A | Discharge status: Home / Self Care | Payer: BC Managed Care – PPO | Source: Ambulatory Visit | Attending: Obstetrics and Gynecology | Admitting: Obstetrics and Gynecology

## 2013-07-02 DIAGNOSIS — Z01419 Encounter for gynecological examination (general) (routine) without abnormal findings: Secondary | ICD-10-CM | POA: Insufficient documentation

## 2013-07-02 DIAGNOSIS — Z1151 Encounter for screening for human papillomavirus (HPV): Secondary | ICD-10-CM | POA: Insufficient documentation

## 2013-07-06 LAB — CYTOLOGY - PAP

## 2013-12-25 ENCOUNTER — Emergency Department (HOSPITAL_COMMUNITY)
Admission: EM | Admit: 2013-12-25 | Discharge: 2013-12-25 | Disposition: A | Discharge status: Home / Self Care | Payer: BC Managed Care – PPO | Attending: Emergency Medicine | Admitting: Emergency Medicine

## 2013-12-25 ENCOUNTER — Encounter (HOSPITAL_COMMUNITY): Payer: Self-pay | Admitting: *Deleted

## 2013-12-25 ENCOUNTER — Emergency Department (HOSPITAL_COMMUNITY): Payer: BC Managed Care – PPO

## 2013-12-25 DIAGNOSIS — S60211A Contusion of right wrist, initial encounter: Secondary | ICD-10-CM | POA: Diagnosis not present

## 2013-12-25 DIAGNOSIS — S91311A Laceration without foreign body, right foot, initial encounter: Secondary | ICD-10-CM | POA: Insufficient documentation

## 2013-12-25 DIAGNOSIS — S50312A Abrasion of left elbow, initial encounter: Secondary | ICD-10-CM | POA: Insufficient documentation

## 2013-12-25 DIAGNOSIS — Y92410 Unspecified street and highway as the place of occurrence of the external cause: Secondary | ICD-10-CM | POA: Insufficient documentation

## 2013-12-25 DIAGNOSIS — S99921A Unspecified injury of right foot, initial encounter: Secondary | ICD-10-CM | POA: Diagnosis present

## 2013-12-25 DIAGNOSIS — Z23 Encounter for immunization: Secondary | ICD-10-CM | POA: Insufficient documentation

## 2013-12-25 DIAGNOSIS — S4991XA Unspecified injury of right shoulder and upper arm, initial encounter: Secondary | ICD-10-CM | POA: Insufficient documentation

## 2013-12-25 DIAGNOSIS — Y998 Other external cause status: Secondary | ICD-10-CM | POA: Insufficient documentation

## 2013-12-25 DIAGNOSIS — S0012XA Contusion of left eyelid and periocular area, initial encounter: Secondary | ICD-10-CM | POA: Insufficient documentation

## 2013-12-25 DIAGNOSIS — Z79899 Other long term (current) drug therapy: Secondary | ICD-10-CM | POA: Insufficient documentation

## 2013-12-25 DIAGNOSIS — S50311A Abrasion of right elbow, initial encounter: Secondary | ICD-10-CM | POA: Insufficient documentation

## 2013-12-25 DIAGNOSIS — Y9389 Activity, other specified: Secondary | ICD-10-CM | POA: Diagnosis not present

## 2013-12-25 DIAGNOSIS — T148XXA Other injury of unspecified body region, initial encounter: Secondary | ICD-10-CM

## 2013-12-25 MED ORDER — BACITRACIN 500 UNIT/GM EX OINT
1.0000 "application " | TOPICAL_OINTMENT | Freq: Two times a day (BID) | CUTANEOUS | Status: DC
  Administered 2013-12-25: 1 via TOPICAL
  Filled 2013-12-25: qty 0.9

## 2013-12-25 MED ORDER — TETANUS-DIPHTH-ACELL PERTUSSIS 5-2.5-18.5 LF-MCG/0.5 IM SUSP
0.5000 mL | Freq: Once | INTRAMUSCULAR | Status: AC
  Administered 2013-12-25: 0.5 mL via INTRAMUSCULAR
  Filled 2013-12-25: qty 0.5

## 2013-12-25 MED ORDER — CEPHALEXIN 500 MG PO CAPS: 500.0000 mg | ORAL_CAPSULE | Freq: Four times a day (QID) | ORAL | Status: DC

## 2013-12-25 MED ORDER — CEPHALEXIN 500 MG PO CAPS
500.0000 mg | ORAL_CAPSULE | Freq: Once | ORAL | Status: AC
  Administered 2013-12-25: 500 mg via ORAL
  Filled 2013-12-25: qty 1

## 2013-12-25 MED ORDER — OXYCODONE-ACETAMINOPHEN 5-325 MG PO TABS
1.0000 | ORAL_TABLET | Freq: Once | ORAL | Status: AC
  Administered 2013-12-25: 1 via ORAL
  Filled 2013-12-25: qty 1

## 2013-12-25 NOTE — Discharge Instructions (Signed)
Wash the affected area with soap and water and apply a thin layer of topical antibiotic ointment. Do this every 12 hours.   Do not use rubbing alcohol or hydrogen peroxide.                        Look for signs of infection: if you see redness, if the area becomes warm, if pain increases sharply, there is discharge (pus), if red streaks appear or you develop fever or vomiting, RETURN immediately to the Emergency Department  for a recheck.   Rest, Ice intermittently (in the first 24-48 hours), Gentle compression with an Ace wrap, and elevate (Limb above the level of the heart)   Take up to 800mg  of ibuprofen (that is usually 4 over the counter pills)  3 times a day for 5 days. Take with food.  Take vicodin for breakthrough pain, do not drink alcohol, drive, care for children or do other critical tasks while taking vicodin.  Do not hesitate to return to the emergency room for any new, worsening or concerning symptoms.  Please obtain primary care using resource guide below. But the minute you were seen in the emergency room and that they will need to obtain records for further outpatient management.   Emergency Department Resource Guide 1) Find a Doctor and Pay Out of Pocket Although you won't have to find out who is covered by your insurance plan, it is a good idea to ask around and get recommendations. You will then need to call the office and see if the doctor you have chosen will accept you as a new patient and what types of options they offer for patients who are self-pay. Some doctors offer discounts or will set up payment plans for their patients who do not have insurance, but you will need to ask so you aren't surprised when you get to your appointment.  2) Contact Your Local Health Department Not all health departments have doctors that can see patients for sick visits, but many do, so it is worth a call to see if yours does. If you don't know where your local health department is, you  can check in your phone book. The CDC also has a tool to help you locate your state's health department, and many state websites also have listings of all of their local health departments.  3) Find a Walk-in Clinic If your illness is not likely to be very severe or complicated, you may want to try a walk in clinic. These are popping up all over the country in pharmacies, drugstores, and shopping centers. They're usually staffed by nurse practitioners or physician assistants that have been trained to treat common illnesses and complaints. They're usually fairly quick and inexpensive. However, if you have serious medical issues or chronic medical problems, these are probably not your best option.  No Primary Care Doctor: - Call Health Connect at  [phone] - they can help you locate a primary care doctor that  accepts your insurance, provides certain services, etc. - Physician Referral Service- [phone]  Chronic Pain Problems: Organization         Address  Phone   Notes  [facility]  [phone] Patients need to be referred by their primary care doctor.   Medication Assistance: Organization         Address  Phone   Notes  [facility] [address] [place], [place] [phone] (  [phone] --Must be a resident of [facility] -- Must have NO insurance coverage whatsoever (no Medicaid/ Medicare, etc.) -- The pt. MUST have a primary care doctor that directs their care regularly and follows them in the community   [facility]  [phone]   [employer]  [phone]    Agencies that provide inexpensive medical care: Organization         Address  Phone   Notes  [facility]  [phone]   [facility]    [phone]   [facility] [address] [place], [place] [address] [phone]   [facility] [address] [place] [phone]   Planned Parenthood    [phone]   [facility]    [phone]   [facility]  [address], [place] Phone:  [phone], Fax:  [phone] Hours of Operation:  9 am - 6 pm, M-F.  Also accepts Medicaid/Medicare and self-pay.  [facility]  [address], [place] Phone: [phone], Fax: [phone]. Hours of Operation:  8:30 am - 5:30 pm, M-F.  Also accepts Medicaid and self-pay.  [facility] [place] [address], [place] Phone: [phone]   [facility] [address] Natasha Bence [place], [place] [phone], Ext. [phone] Mondays & Thursdays: 7-9 AM.  First 15 patients are seen on a first come, first serve basis.    Medicaid-accepting [facility] Providers:  Organization         Address  Phone   Notes  [facility] [address], [place] [phone] Also accepts self-pay patients.  [facility] [address] Laurell Josephs [place], [place]  [phone]   [facility] [address], [place] [phone]   [facility] [address] [place] [phone]   Renaye Rakers [address], [place]   [phone] Only accepts [place] patients after they have their name applied to their card.   Self-Pay (no insurance) in [facility]:  Organization         Address  Phone   Notes  Sickle Cell Patients, [facility] [address] [place], [place] [phone]   [facility] [address] [place], [place] [phone]   [facility] [place]  [address], [place] [phone]   [facility]/Dr. Osei-Bonsu  [address], [place] or [address], [place] [phone] Phone number for both [place] and [place] locations  is the same.  [facility] and [facility] [address] [place] [phone]   [facility] [address], [place] or [address] [phone]   [facility] [address] [place] [phone], phone; [phone], fax Sees patients 1st and 3rd Saturday of every month.  Must not qualify for public or private insurance (i.e. Medicaid, Medicare, [place] Health Choice, Veterans' Benefits)  Household income should be no more than 200% of the poverty level The clinic cannot treat you if you are pregnant or think you are pregnant  Sexually transmitted diseases are not treated at the clinic.    Dental Care: Organization  Address  Phone  Notes  [place] [facility] [address], [place] [phone] Accepts children up to age 45 who are enrolled in [place]icaid or [place] Health Choice; pregnant women with a Medicaid card; and children who have applied for Medicaid or [place] Health Choice, but were declined, whose parents can pay a reduced fee at time of service.  [facility] [place]  [address], [place] [phone]) [phone] Accepts children up to age 45 who are enrolled in [place]icaid or [place] Health Choice; pregnant women with a Medicaid card; and children who have applied for Medicaid or [place] Health Choice, but were declined, whose parents can pay a reduced fee at time of service.  [facility]  [address], [place] [phone] Patients are seen by appointment only. Walk-ins are not accepted. [facility] will see patients 45 years of age and oAvera Sacred Heart Hospitallder. Monday - Tuesday (8am-5pm) Most Wednesdays (8:30-5pm) $30 per visit, cash only  [facility]  [address], [facility] [phone] Patients are seen by appointment only. Walk-ins are not accepted. [facility] will see  patients 45 years of age and older. One Wednesday Evening (Monthly: Volunteer Based).  $30 per visit, cash only  [employer] [facility]  [phone] for adults; Children under age 274, call Graduate Pediatric Dentistry at [phone]) [phone]. Children aged 534-14, please call [phone] to request a pediatric application.  Dental services are provided in all areas of dental care including fillings, crowns and bridges, complete and partial dentures, implants, gum treatment, root canals, and extractions. Preventive care is also provided. Treatment is provided to both adults and children. Patients are selected via a lottery and there is often a waiting list.   [facility] [address], [place]  [phone] [email or web address]   [facility] [address], [place], [place] [phone], Ext. [phone] Second and Fourth Thursday of each month, opens at 6:30 AM; Clinic ends at 9 AM.  Patients are seen on a first-come first-served basis, and a limited number are seen during each clinic.   [facility]  [address], [place], [place] [phone]   Eligibility Requirements You must have lived in [place], [place], or [place] for at least the last three months.   You cannot be eligible for state or federal sponsored National Cityhealthcare insurance, including CIGNAVeterans Administration, [place]icaid, or [employer]Medicare.   You generally cannot be eligible for healthcare insurance through your employer.    How to apply: Eligibility screenings are held every Tuesday and Wednesday afternoon from 1:00 pm until 4:00 pm. You do not need an appointment for the interview!  [facility] [address], [place], [place] [phone]   [facility]  [phone]   [facility]  [phone]   [facility]  [phone]    Behavioral Health Resources in the Community: Intensive Outpatient  Programs Organization         Address  Phone  Notes  [facility] [address], [place], [place] [phone]   [facility] Outpatient [address], [place], [place] [phone]   [facility] [address], [place], [place]  [phone]   [facility] [address],  [place], [place] [phone] or [phone]   Substance Abuse Resources  Organization         Address  Phone  Notes  [facility]  [phone]   [facility]  [phone]   [facility]  [phone]   [facility]  [phone]   Residential & Outpatient Substance Abuse Program  [phone]   Psychological Services Organization         Address  Phone  Notes  [facility]  [phone]- [phone]   [facility]  [phone]- [phone]   [facility] [address], [place] [phone] or [phone]    Mobile Crisis Teams Organization         Address  Phone  Notes  [facility], Mobile Crisis Care Unit  [phone]   [facility]  [address] [place], [place] [phone]   Doristine LocksSharon DeEsch [address] [place] [phone]    Self-Help/Support Groups Organization         Address  Phone             Notes  [facility] [place] - variety of support groups  336- [phone] Call for more information  Narcotics Anonymous (NA), Caring [facility] [address], [place]  2 meetings at this location   Statisticianesidential Treatment Programs Organization         Address  Phone  Notes  [facility] [address],    [place]  [phone]   [facility]  [address], [place] [phone], [place], [place] [phone]   [facility] [address], [place] [phone] Admissions: 8am-3pm M-F  [facility] [address]    [place], [place]  [phone]   [facility] [address], [place], [place] [phone]   [facility] [address]  [place], [place] [phone]   [facility] - Intensive Outpatient [address], [place], [place] [phone]   [facility] ([facility] [address]  [place], [place] [phone] or [phone]   [facility] ([facility]) [address] [place], [place] [phone] Accepts Medicaid  [facility] [address]  [place] [phone] Substance Abuse/Addiction Treatment   [facility] Resources Organization         Address  Phone  Notes  [facility]  [phone]) [phone]   Angie FavaJulie Brannon, PhD [address], [address] [place], [place]   [phone]) [phone] or [phone](336) [phone]   [facility]   [address] [place], [place] [phone](336) [phone]   [facility] [address], [place], [place] [phone](336) [phone] Insurance/Medicaid/sponsorship through [facility]  Faith and Families [address]                                    [place], [place] [phone]) [phone] Therapy/tele-psych/case  [facility] [address].   [place], [place] [phone]) [phone]    Dr. Lolly MustacheArfeen  [phone]   [facility] [place]  [employer] [facility] 1) [address], [place] 2) [address], [place] 3)  [address], [place] [phone](336) [phone]  [phone]) [phone]   [facility] Child Abuse Hotline [phone](336) [phone] or [phone]) [phone] (After Hours)
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
## 2013-12-26 NOTE — ED Notes (Signed)
Pt called with questions regarding pain medicine and oozing of the wound. She was advised after speaking with Dr Fredderick PhenixBelfi to take Tylenol and/or Ibuprofen as directed on bottle, if wound starts bleeding or increased symptoms of concern, return to ED for wound recheck. Pt in agreement and thanked nurse for returning call with information.

## 2013-12-27 ENCOUNTER — Encounter (HOSPITAL_COMMUNITY): Payer: Self-pay | Admitting: Emergency Medicine

## 2013-12-27 ENCOUNTER — Emergency Department (HOSPITAL_COMMUNITY)
Admission: EM | Admit: 2013-12-27 | Discharge: 2013-12-27 | Disposition: A | Discharge status: Home / Self Care | Payer: BC Managed Care – PPO | Attending: Emergency Medicine | Admitting: Emergency Medicine

## 2013-12-27 DIAGNOSIS — S91311D Laceration without foreign body, right foot, subsequent encounter: Secondary | ICD-10-CM | POA: Insufficient documentation

## 2013-12-27 DIAGNOSIS — Z792 Long term (current) use of antibiotics: Secondary | ICD-10-CM | POA: Diagnosis not present

## 2013-12-27 DIAGNOSIS — X58XXXA Exposure to other specified factors, initial encounter: Secondary | ICD-10-CM | POA: Diagnosis not present

## 2013-12-27 DIAGNOSIS — Z79899 Other long term (current) drug therapy: Secondary | ICD-10-CM | POA: Insufficient documentation

## 2013-12-27 MED ORDER — HYDROCODONE-ACETAMINOPHEN 5-325 MG PO TABS: 1.0000 | ORAL_TABLET | Freq: Four times a day (QID) | ORAL | Status: DC | PRN

## 2013-12-27 MED ORDER — BUPIVACAINE HCL (PF) 0.5 % IJ SOLN
20.0000 mL | Freq: Once | INTRAMUSCULAR | Status: AC
  Administered 2013-12-27: 20 mL
  Filled 2013-12-27: qty 30

## 2013-12-27 MED ORDER — LIDOCAINE HCL 2 % IJ SOLN
10.0000 mL | Freq: Once | INTRAMUSCULAR | Status: AC
  Administered 2013-12-27: 200 mg
  Filled 2013-12-27: qty 20

## 2013-12-27 NOTE — ED Notes (Signed)
Pts R foot soaking in NS and betadine.

## 2013-12-27 NOTE — ED Notes (Signed)
Bacitracin ointment applied in between toe, gauze placed after and buddy taped.

## 2013-12-27 NOTE — ED Provider Notes (Signed)
CSN: [id number]     Arrival date & time 12/27/13  2011 History   None    Chief Complaint  Patient presents with  . Extremity Laceration   The history is provided by the patient. No language interpreter was used.   This chart was scribed for nurse practitioner Earley FavorGail Rithika Seel, NP working with Linwood DibblesJon Knapp, MD, by Andrew Auaven Small, ED Scribe. This patient was seen in room WTR9/WTR9 and the patient's care was started at 9:02 PM.  Kara Hoover is a 43 y.o. female who presents to the Emergency Department complaining of laceration to right foot. Pt states she was seen here 2 days ago for right foot laceration. At that time they cleaned wound and was told to return for sutures. Pt states she has been taking abx 4 times a day.    History reviewed. No pertinent past medical history. Past Surgical History  Procedure Laterality Date  . Abdominal hysterectomy     Family History  Problem Relation Age of Onset  . Cancer Other    History  Substance Use Topics  . Smoking status: Never Smoker   . Smokeless tobacco: Not on file  . Alcohol Use: Yes   OB History    No data available     Review of Systems  Constitutional: Negative for fever.  Musculoskeletal: Negative for joint swelling.  Skin: Positive for wound.   Allergies  Review of patient's allergies indicates no known allergies.  Home Medications   Prior to Admission medications   Medication Sig Start Date End Date Taking? Authorizing Provider  cephALEXin (KEFLEX) 500 MG capsule Take 1 capsule (500 mg total) by mouth 4 (four) times daily. 12/25/13   Nicole Pisciotta, PA-C  furosemide (LASIX) 20 MG tablet Take 1 tablet (20 mg total) by mouth daily as needed. For foot swelling 06/10/13   Thao P Le, DO   BP 139/88 mmHg  Pulse 85  Temp(Src) 98 F (36.7 C) (Oral)  Resp 18  SpO2 99% Physical Exam  Constitutional: She is oriented to person, place, and time. She appears well-developed and well-nourished. No distress.  HENT:  Head:  Normocephalic and atraumatic.  Eyes: Conjunctivae and EOM are normal.  Neck: Neck supple.  Cardiovascular: Normal rate.   Pulmonary/Chest: Effort normal.  Musculoskeletal: Normal range of motion. She exhibits edema.       Feet:  Neurological: She is alert and oriented to person, place, and time.  Skin: Skin is warm and dry.  Pt presents for delayed closure. It is between 1st and 2nd  toes and extends to ball of foot. Approximately 3cm long. No active bleeding. Foot is edematous.   Psychiatric: She has a normal mood and affect. Her behavior is normal.  Nursing note and vitals reviewed.   ED Course  Procedures (including critical care time) LACERATION REPAIR PROCEDURE NOTE The patient's identification was confirmed and consent was obtained. This procedure was performed by Earley FavorGail Priscila Bean at 10:21 PM. Site: between 1st and 2nd toes  Sterile procedures observed Anesthetic used (type and amt): lidocaine and .5 bupivacaine mixed  Length:3 cm  # of Sutures: 4 sutures Technique: Interrupted Complexity: complex Antibx ointment applied and buddy taped the toes Tetanus UTD Site anesthetized, irrigated with NS, explored without evidence of foreign body, wound well approximated, site covered with dry, sterile dressing.  Patient tolerated procedure well without complications. Instructions for care discussed verbally and patient provided with additional written instructions for homecare and f/u.  DIAGNOSTIC STUDIES: Oxygen Saturation is 99% on RA,  normal by my interpretation.    COORDINATION OF CARE: 9:26 PM- Pt advised of plan for treatment and pt agrees.  Labs Review Labs Reviewed - No data to display  Imaging Review No results found.   EKG Interpretation None     Patient has been instructed to apply antibiotic ointment over the sutures for the next 14 days and keep the toes buddy taped together for at least 2 days thereafter as comfort sutures should be removed in 14 days.  Patient  has been instructed to watch for any signs of infection including redness, pain, pus, increased swelling, now she's an fevers. MDM   Final diagnoses:  None       I personally performed the services described in this documentation, which was scribed in my presence. The recorded information has been reviewed and is accurate.    Arman FilterGail K Arlis Yale, NP 12/27/13 [id number]  Linwood DibblesJon Knapp, MD 12/27/13 [phone]

## 2013-12-27 NOTE — Discharge Instructions (Signed)
As discussed please continue taking her antibiotic as prescribed until all tablets have been completed.  I would like you to change the dressing on a daily basis, but keep your toes buddy taped together for at least 2 days after that, it is more comfortable to keep them together.  Please do watch for any signs of infection, redness, pain, pus from the site red streaking or you start feeling better.  Running a fever.  Please return immediately for further evaluation.  The sutures should be removed in 14 days

## 2013-12-27 NOTE — ED Notes (Signed)
Pt states she cut her right foot on Saturday and came here  States they cleaned her foot out and put a bandage on it  Pt states they put her on antibiotics and told her to come back today to have it stitched up

## 2014-06-16 ENCOUNTER — Encounter (HOSPITAL_BASED_OUTPATIENT_CLINIC_OR_DEPARTMENT_OTHER): Payer: BLUE CROSS/BLUE SHIELD | Attending: Internal Medicine

## 2014-06-16 DIAGNOSIS — L97412 Non-pressure chronic ulcer of right heel and midfoot with fat layer exposed: Secondary | ICD-10-CM | POA: Insufficient documentation

## 2014-06-16 DIAGNOSIS — L02611 Cutaneous abscess of right foot: Secondary | ICD-10-CM | POA: Diagnosis not present

## 2014-06-16 DIAGNOSIS — F17200 Nicotine dependence, unspecified, uncomplicated: Secondary | ICD-10-CM | POA: Diagnosis not present

## 2014-06-23 DIAGNOSIS — L97412 Non-pressure chronic ulcer of right heel and midfoot with fat layer exposed: Secondary | ICD-10-CM | POA: Diagnosis not present

## 2014-06-23 DIAGNOSIS — L02611 Cutaneous abscess of right foot: Secondary | ICD-10-CM | POA: Diagnosis not present

## 2014-06-23 DIAGNOSIS — F17200 Nicotine dependence, unspecified, uncomplicated: Secondary | ICD-10-CM | POA: Diagnosis not present

## 2014-06-30 ENCOUNTER — Encounter (HOSPITAL_BASED_OUTPATIENT_CLINIC_OR_DEPARTMENT_OTHER): Payer: BLUE CROSS/BLUE SHIELD | Attending: Internal Medicine

## 2014-06-30 DIAGNOSIS — L97511 Non-pressure chronic ulcer of other part of right foot limited to breakdown of skin: Secondary | ICD-10-CM | POA: Insufficient documentation

## 2014-06-30 DIAGNOSIS — L02611 Cutaneous abscess of right foot: Secondary | ICD-10-CM | POA: Diagnosis not present

## 2014-07-14 DIAGNOSIS — L97511 Non-pressure chronic ulcer of other part of right foot limited to breakdown of skin: Secondary | ICD-10-CM | POA: Diagnosis not present

## 2014-07-14 DIAGNOSIS — L02611 Cutaneous abscess of right foot: Secondary | ICD-10-CM | POA: Diagnosis not present

## 2014-07-25 DIAGNOSIS — L97511 Non-pressure chronic ulcer of other part of right foot limited to breakdown of skin: Secondary | ICD-10-CM | POA: Diagnosis not present

## 2014-07-25 DIAGNOSIS — L02611 Cutaneous abscess of right foot: Secondary | ICD-10-CM | POA: Diagnosis not present

## 2014-08-02 ENCOUNTER — Encounter (HOSPITAL_BASED_OUTPATIENT_CLINIC_OR_DEPARTMENT_OTHER): Payer: BLUE CROSS/BLUE SHIELD | Attending: General Surgery

## 2014-08-02 DIAGNOSIS — L97511 Non-pressure chronic ulcer of other part of right foot limited to breakdown of skin: Secondary | ICD-10-CM | POA: Insufficient documentation

## 2014-08-11 DIAGNOSIS — L97511 Non-pressure chronic ulcer of other part of right foot limited to breakdown of skin: Secondary | ICD-10-CM | POA: Diagnosis not present

## 2015-07-04 DIAGNOSIS — N760 Acute vaginitis: Secondary | ICD-10-CM | POA: Diagnosis not present

## 2015-07-04 DIAGNOSIS — E559 Vitamin D deficiency, unspecified: Secondary | ICD-10-CM | POA: Diagnosis not present

## 2015-07-18 DIAGNOSIS — A599 Trichomoniasis, unspecified: Secondary | ICD-10-CM | POA: Diagnosis not present

## 2015-07-18 DIAGNOSIS — E559 Vitamin D deficiency, unspecified: Secondary | ICD-10-CM | POA: Diagnosis not present

## 2016-03-20 DIAGNOSIS — M542 Cervicalgia: Secondary | ICD-10-CM | POA: Diagnosis not present

## 2016-04-19 DIAGNOSIS — M25571 Pain in right ankle and joints of right foot: Secondary | ICD-10-CM | POA: Diagnosis not present

## 2016-04-19 DIAGNOSIS — M2011 Hallux valgus (acquired), right foot: Secondary | ICD-10-CM | POA: Diagnosis not present

## 2016-04-19 DIAGNOSIS — M7751 Other enthesopathy of right foot: Secondary | ICD-10-CM | POA: Diagnosis not present

## 2016-04-19 DIAGNOSIS — D2371 Other benign neoplasm of skin of right lower limb, including hip: Secondary | ICD-10-CM | POA: Diagnosis not present
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
## 2016-05-09 DIAGNOSIS — N898 Other specified noninflammatory disorders of vagina: Secondary | ICD-10-CM | POA: Diagnosis not present

## 2016-05-09 DIAGNOSIS — G4700 Insomnia, unspecified: Secondary | ICD-10-CM | POA: Diagnosis not present

## 2016-05-10 DIAGNOSIS — M79671 Pain in right foot: Secondary | ICD-10-CM | POA: Diagnosis not present

## 2016-05-10 DIAGNOSIS — B079 Viral wart, unspecified: Secondary | ICD-10-CM | POA: Diagnosis not present

## 2016-09-20 DIAGNOSIS — M722 Plantar fascial fibromatosis: Secondary | ICD-10-CM | POA: Diagnosis not present

## 2016-09-20 DIAGNOSIS — Z01812 Encounter for preprocedural laboratory examination: Secondary | ICD-10-CM | POA: Diagnosis not present

## 2016-09-20 DIAGNOSIS — M25571 Pain in right ankle and joints of right foot: Secondary | ICD-10-CM | POA: Diagnosis not present

## 2016-09-20 DIAGNOSIS — Z79899 Other long term (current) drug therapy: Secondary | ICD-10-CM | POA: Diagnosis not present

## 2016-09-20 DIAGNOSIS — M2011 Hallux valgus (acquired), right foot: Secondary | ICD-10-CM | POA: Diagnosis not present

## 2016-11-22 ENCOUNTER — Other Ambulatory Visit (HOSPITAL_COMMUNITY)
Admission: RE | Admit: 2016-11-22 | Discharge: 2016-11-22 | Disposition: A | Discharge status: Home / Self Care | Payer: BLUE CROSS/BLUE SHIELD | Source: Ambulatory Visit | Attending: Obstetrics and Gynecology | Admitting: Obstetrics and Gynecology

## 2016-11-22 ENCOUNTER — Other Ambulatory Visit: Payer: Self-pay | Admitting: Obstetrics and Gynecology

## 2016-11-22 DIAGNOSIS — Z01411 Encounter for gynecological examination (general) (routine) with abnormal findings: Secondary | ICD-10-CM | POA: Diagnosis not present

## 2016-11-22 DIAGNOSIS — Z124 Encounter for screening for malignant neoplasm of cervix: Secondary | ICD-10-CM | POA: Insufficient documentation

## 2016-11-22 DIAGNOSIS — N760 Acute vaginitis: Secondary | ICD-10-CM | POA: Diagnosis not present

## 2016-11-28 LAB — CYTOLOGY - PAP
Diagnosis: NEGATIVE
HPV: NOT DETECTED

## 2016-11-28 LAB — HM PAP SMEAR

## 2017-06-03 DIAGNOSIS — B373 Candidiasis of vulva and vagina: Secondary | ICD-10-CM | POA: Diagnosis not present

## 2017-06-05 DIAGNOSIS — M542 Cervicalgia: Secondary | ICD-10-CM | POA: Diagnosis not present

## 2017-11-22 DIAGNOSIS — S40862A Insect bite (nonvenomous) of left upper arm, initial encounter: Secondary | ICD-10-CM | POA: Diagnosis not present

## 2017-11-22 DIAGNOSIS — L03114 Cellulitis of left upper limb: Secondary | ICD-10-CM | POA: Diagnosis not present

## 2017-12-12 DIAGNOSIS — Z01812 Encounter for preprocedural laboratory examination: Secondary | ICD-10-CM | POA: Diagnosis not present

## 2017-12-12 DIAGNOSIS — M24571 Contracture, right ankle: Secondary | ICD-10-CM | POA: Diagnosis not present

## 2017-12-12 DIAGNOSIS — M722 Plantar fascial fibromatosis: Secondary | ICD-10-CM | POA: Diagnosis not present

## 2017-12-12 DIAGNOSIS — F526 Dyspareunia not due to a substance or known physiological condition: Secondary | ICD-10-CM | POA: Diagnosis not present

## 2017-12-12 DIAGNOSIS — Z79899 Other long term (current) drug therapy: Secondary | ICD-10-CM | POA: Diagnosis not present

## 2017-12-12 DIAGNOSIS — N898 Other specified noninflammatory disorders of vagina: Secondary | ICD-10-CM | POA: Diagnosis not present

## 2017-12-12 DIAGNOSIS — M21611 Bunion of right foot: Secondary | ICD-10-CM | POA: Diagnosis not present

## 2017-12-23 DIAGNOSIS — M722 Plantar fascial fibromatosis: Secondary | ICD-10-CM | POA: Diagnosis not present

## 2017-12-23 DIAGNOSIS — M24571 Contracture, right ankle: Secondary | ICD-10-CM | POA: Diagnosis not present

## 2017-12-23 DIAGNOSIS — M2011 Hallux valgus (acquired), right foot: Secondary | ICD-10-CM | POA: Diagnosis not present

## 2017-12-29 DIAGNOSIS — M12271 Villonodular synovitis (pigmented), right ankle and foot: Secondary | ICD-10-CM | POA: Diagnosis not present

## 2018-01-05 DIAGNOSIS — M12271 Villonodular synovitis (pigmented), right ankle and foot: Secondary | ICD-10-CM | POA: Diagnosis not present

## 2018-01-23 DIAGNOSIS — M21611 Bunion of right foot: Secondary | ICD-10-CM | POA: Diagnosis not present

## 2018-01-23 DIAGNOSIS — M12271 Villonodular synovitis (pigmented), right ankle and foot: Secondary | ICD-10-CM | POA: Diagnosis not present

## 2018-02-20 DIAGNOSIS — M12271 Villonodular synovitis (pigmented), right ankle and foot: Secondary | ICD-10-CM | POA: Diagnosis not present

## 2018-04-03 DIAGNOSIS — M12271 Villonodular synovitis (pigmented), right ankle and foot: Secondary | ICD-10-CM | POA: Diagnosis not present

## 2018-04-03 DIAGNOSIS — M25571 Pain in right ankle and joints of right foot: Secondary | ICD-10-CM | POA: Diagnosis not present

## 2018-12-12 ENCOUNTER — Other Ambulatory Visit: Payer: Self-pay

## 2018-12-12 ENCOUNTER — Encounter (HOSPITAL_COMMUNITY): Payer: Self-pay | Admitting: Physician Assistant

## 2018-12-12 ENCOUNTER — Emergency Department (HOSPITAL_COMMUNITY)
Admission: EM | Admit: 2018-12-12 | Discharge: 2018-12-12 | Disposition: A | Discharge status: Home / Self Care | Payer: BC Managed Care – PPO | Attending: Emergency Medicine | Admitting: Emergency Medicine

## 2018-12-12 ENCOUNTER — Emergency Department (HOSPITAL_COMMUNITY): Payer: BC Managed Care – PPO

## 2018-12-12 DIAGNOSIS — U071 COVID-19: Secondary | ICD-10-CM | POA: Diagnosis not present

## 2018-12-12 DIAGNOSIS — R0981 Nasal congestion: Secondary | ICD-10-CM | POA: Insufficient documentation

## 2018-12-12 DIAGNOSIS — R05 Cough: Secondary | ICD-10-CM | POA: Diagnosis not present

## 2018-12-12 DIAGNOSIS — R509 Fever, unspecified: Secondary | ICD-10-CM | POA: Insufficient documentation

## 2018-12-12 DIAGNOSIS — R059 Cough, unspecified: Secondary | ICD-10-CM

## 2018-12-12 NOTE — ED Triage Notes (Signed)
Pt endorses runny nose and dry cough x 2 days. Took oral temp at home and it was 100. Denies CP or shob. VSS.

## 2018-12-12 NOTE — ED Notes (Signed)
Patient verbalizes understanding of discharge instructions. Opportunity for questioning and answers were provided. Armband removed by staff, pt discharged from ED.  

## 2018-12-12 NOTE — ED Provider Notes (Signed)
[facility] EMERGENCY DEPARTMENT Provider Note   CSN: [id number] Arrival date & time: 12/12/18  1801     History   Chief Complaint Chief Complaint  Patient presents with  . Cough  . Nasal Congestion    HPI Kara Hoover is a 48 y.o. female with no significant past medical history presents today for evaluation of cough.  She reports that over the past 3 days she has had nasal congestion and occasional cough.  She reports that she came in today as she checked her temperature at home and it was 100.  She denies taking any ibuprofen or Tylenol today.  She reports that she feels like she is getting a headache however does not currently have a headache.  She denies any known sick contacts.  She has not had significant seasonal allergies in the past and does not take any allergy medicines.  She denies any chest pain or shortness of breath.  No nausea vomiting or diarrhea.     HPI  History reviewed. No pertinent past medical history.  There are no active problems to display for this patient.   Past Surgical History:  Procedure Laterality Date  . ABDOMINAL HYSTERECTOMY       OB History   No obstetric history on file.      Home Medications    Prior to Admission medications   Medication Sig Start Date End Date Taking? Authorizing Provider  cephALEXin (KEFLEX) 500 MG capsule Take 1 capsule (500 mg total) by mouth 4 (four) times daily. 12/25/13   Pisciotta, Joni Reining, PA-C  furosemide (LASIX) 20 MG tablet Take 1 tablet (20 mg total) by mouth daily as needed. For foot swelling 06/10/13   Le, Thao P, DO  HYDROcodone-acetaminophen (NORCO/VICODIN) 5-325 MG per tablet Take 1 tablet by mouth every 6 (six) hours as needed for severe pain. 12/27/13   Earley Favor, NP    Family History Family History  Problem Relation Age of Onset  . Cancer Other     Social History Social History   Tobacco Use  . Smoking status: Never Smoker  Substance Use Topics  .  Alcohol use: Yes  . Drug use: No     Allergies   Patient has no known allergies.   Review of Systems Review of Systems  Constitutional: Positive for fatigue and fever (Subjective 100 orally). Negative for chills.  HENT: Positive for congestion. Negative for sore throat and trouble swallowing.   Respiratory: Positive for cough. Negative for shortness of breath.   Cardiovascular: Negative for chest pain.  Gastrointestinal: Negative for abdominal pain.  Neurological: Negative for weakness.  All other systems reviewed and are negative.    Physical Exam Updated Vital Signs BP 140/90 (BP Location: Right Arm)   Pulse 98   Temp 99.5 F (37.5 C)   Resp 18   SpO2 100%   Physical Exam Vitals signs and nursing note reviewed.  Constitutional:      General: She is not in acute distress.    Appearance: She is not ill-appearing.  HENT:     Head: Normocephalic and atraumatic.     Right Ear: Tympanic membrane, ear canal and external ear normal.     Left Ear: Tympanic membrane, ear canal and external ear normal.     Nose: Nose normal.     Mouth/Throat:     Mouth: Mucous membranes are moist.     Pharynx: No oropharyngeal exudate or posterior oropharyngeal erythema.  Eyes:     General:  No scleral icterus.       Right eye: No discharge.        Left eye: No discharge.     Conjunctiva/sclera: Conjunctivae normal.  Neck:     Musculoskeletal: Normal range of motion and neck supple. No muscular tenderness.  Cardiovascular:     Rate and Rhythm: Normal rate and regular rhythm.     Heart sounds: Normal heart sounds.  Pulmonary:     Effort: Pulmonary effort is normal. No respiratory distress.     Breath sounds: Normal breath sounds. No stridor. No wheezing.  Lymphadenopathy:     Cervical: No cervical adenopathy.  Skin:    General: Skin is warm and dry.  Neurological:     Mental Status: She is alert.     Gait: Gait normal.  Psychiatric:        Mood and Affect: Mood normal.         Behavior: Behavior normal.      ED Treatments / Results  Labs (all labs ordered are listed, but only abnormal results are displayed) Labs Reviewed  SARS CORONAVIRUS 2 (TAT 6-24 HRS)    EKG None  Radiology Dg Chest 2 View  Result Date: 12/12/2018 CLINICAL DATA:  Cough EXAM: CHEST - 2 VIEW COMPARISON:  02/08/2006 FINDINGS: Heart and mediastinal contours are within normal limits. No focal opacities or effusions. No acute bony abnormality. IMPRESSION: No active cardiopulmonary disease. Electronically Signed   By: Rolm Baptise M.D.   On: 12/12/2018 19:15    Procedures Procedures (including critical care time)  Medications Ordered in ED Medications - No data to display   Initial Impression / Assessment and Plan / ED Course  I have reviewed the triage vital signs and the nursing notes.  Pertinent labs & imaging results that were available during my care of the patient were reviewed by me and considered in my medical decision making (see chart for details).       Patient presents today for evaluation of cough and nasal congestion for 3 days.  On exam her lungs are clear to auscultation bilaterally.  Heart with regular rate and rhythm, no murmurs.  HEENT exam is unremarkable.  Chest x-ray obtained without evidence of focal consolidation or acute abnormalities.  Kara Hoover was evaluated in Emergency Department on 12/12/2018 for the symptoms described in the history of present illness. She was evaluated in the context of the global COVID-19 pandemic, which necessitated consideration that the patient might be at risk for infection with the SARS-CoV-2 virus that causes COVID-19. Institutional protocols and algorithms that pertain to the evaluation of patients at risk for COVID-19 are in a state of rapid change based on information released by regulatory bodies including the CDC and federal and state organizations. These policies and algorithms were followed during the patient's  care in the ED.   I suspect that her symptoms may be related to seasonal allergies, however given the global pandemic will test her for coronavirus.  She is instructed on appropriate isolation and given a work note.  I also recommended over-the-counter allergy treatment including OTC second-generation antihistamine and nasal corticosteroid spray.  She is hemodynamically stable and afebrile while in my care.  Return precautions were discussed with patient who states their understanding.  At the time of discharge patient denied any unaddressed complaints or concerns.  Patient is agreeable for discharge home.   Final Clinical Impressions(s) / ED Diagnoses   Final diagnoses:  Cough    ED Discharge Orders  None       Norman ClayHammond, Haim Hansson W, PA-C 12/12/18 2055    Rolan BuccoBelfi, Melanie, MD 12/12/18 [phone]

## 2018-12-12 NOTE — Discharge Instructions (Signed)
As we discussed today your chest x-ray was reassuring.  Please start treating your symptoms as allergies.  I would recommend that you use a nasal steroid spray such as Flonase or Nasacort.  In addition please start taking either Allegra, Claritin, or Zyrtec.  You have a coronavirus test today.  If this is positive you need to quarantine from 10 days from the start of your symptoms as instructed in the handout. If your test is negative and you are still having symptoms you need to assume this is a false negative and remain quarantined.  I would recommend a repeat test as a outpatient through one of the community testing sites if this is the case and 2 to 3 days.  If your test is negative and your symptoms are fully resolved you may return to work.

## 2018-12-13 LAB — SARS CORONAVIRUS 2 (TAT 6-24 HRS): SARS Coronavirus 2: POSITIVE — AB

## 2018-12-23 ENCOUNTER — Other Ambulatory Visit: Payer: Self-pay

## 2018-12-23 ENCOUNTER — Encounter (HOSPITAL_BASED_OUTPATIENT_CLINIC_OR_DEPARTMENT_OTHER): Payer: Self-pay

## 2018-12-23 ENCOUNTER — Emergency Department (HOSPITAL_BASED_OUTPATIENT_CLINIC_OR_DEPARTMENT_OTHER): Payer: BC Managed Care – PPO

## 2018-12-23 ENCOUNTER — Emergency Department (HOSPITAL_BASED_OUTPATIENT_CLINIC_OR_DEPARTMENT_OTHER)
Admission: EM | Admit: 2018-12-23 | Discharge: 2018-12-23 | Disposition: A | Discharge status: Home / Self Care | Payer: BC Managed Care – PPO | Attending: Emergency Medicine | Admitting: Emergency Medicine

## 2018-12-23 DIAGNOSIS — M549 Dorsalgia, unspecified: Secondary | ICD-10-CM | POA: Diagnosis not present

## 2018-12-23 DIAGNOSIS — U071 COVID-19: Secondary | ICD-10-CM | POA: Diagnosis not present

## 2018-12-23 DIAGNOSIS — R05 Cough: Secondary | ICD-10-CM | POA: Insufficient documentation

## 2018-12-23 DIAGNOSIS — R1012 Left upper quadrant pain: Secondary | ICD-10-CM | POA: Insufficient documentation

## 2018-12-23 DIAGNOSIS — R0602 Shortness of breath: Secondary | ICD-10-CM | POA: Diagnosis not present

## 2018-12-23 LAB — CBC WITH DIFFERENTIAL/PLATELET
Abs Immature Granulocytes: 0.03 10*3/uL (ref 0.00–0.07)
Basophils Absolute: 0 10*3/uL (ref 0.0–0.1)
Basophils Relative: 0 %
Eosinophils Absolute: 0.2 10*3/uL (ref 0.0–0.5)
Eosinophils Relative: 3 %
HCT: 41.8 % (ref 36.0–46.0)
Hemoglobin: 13.3 g/dL (ref 12.0–15.0)
Immature Granulocytes: 1 %
Lymphocytes Relative: 30 %
Lymphs Abs: 1.6 10*3/uL (ref 0.7–4.0)
MCH: 30.4 pg (ref 26.0–34.0)
MCHC: 31.8 g/dL (ref 30.0–36.0)
MCV: 95.4 fL (ref 80.0–100.0)
Monocytes Absolute: 0.6 10*3/uL (ref 0.1–1.0)
Monocytes Relative: 12 %
Neutro Abs: 2.8 10*3/uL (ref 1.7–7.7)
Neutrophils Relative %: 54 %
Platelets: 226 10*3/uL (ref 150–400)
RBC: 4.38 MIL/uL (ref 3.87–5.11)
RDW: 11.9 % (ref 11.5–15.5)
WBC: 5.2 10*3/uL (ref 4.0–10.5)
nRBC: 0 % (ref 0.0–0.2)

## 2018-12-23 LAB — COMPREHENSIVE METABOLIC PANEL
ALT: 17 U/L (ref 0–44)
AST: 16 U/L (ref 15–41)
Albumin: 3.7 g/dL (ref 3.5–5.0)
Alkaline Phosphatase: 107 U/L (ref 38–126)
Anion gap: 9 (ref 5–15)
BUN: 11 mg/dL (ref 6–20)
CO2: 25 mmol/L (ref 22–32)
Calcium: 9 mg/dL (ref 8.9–10.3)
Chloride: 109 mmol/L (ref 98–111)
Creatinine, Ser: 1.09 mg/dL — ABNORMAL HIGH (ref 0.44–1.00)
GFR calc Af Amer: >60 mL/min (ref >60)
GFR calc non Af Amer: 59 mL/min — ABNORMAL LOW (ref >60)
Glucose, Bld: 95 mg/dL (ref 70–99)
Potassium: 4.6 mmol/L (ref 3.5–5.1)
Sodium: 143 mmol/L (ref 135–145)
Total Bilirubin: 0.4 mg/dL (ref 0.3–1.2)
Total Protein: 6.6 g/dL (ref 6.5–8.1)

## 2018-12-23 LAB — LIPASE, BLOOD: Lipase: 37 U/L (ref 11–51)

## 2018-12-23 MED ORDER — BENZONATATE 100 MG PO CAPS: 100.0000 mg | ORAL_CAPSULE | Freq: Three times a day (TID) | ORAL | 0 refills | Status: DC

## 2018-12-23 NOTE — ED Provider Notes (Signed)
[facility] EMERGENCY DEPARTMENT Provider Note   CSN: [id number] Arrival date & time: 12/23/18  1210     History   Chief Complaint Chief Complaint  Patient presents with  . Shortness of Breath  . Back Pain    HPI Kara Hoover is a 48 y.o. female who is previously healthy who recently tested positive for COVID-19 who presents with shortness of breath and back pain.  Patient has had associated cough.  She reports she has had intermittent clear sputum.  She reports very intermittent, fleeting sharp chest pain at times.  She has also had some intermittent left upper quadrant pain, however patient states she has been taking a supplement to lose weight.  She reports her whole family has COVID-19.  She denies any nausea, vomiting, diarrhea, bloody stools, urinary symptoms.     HPI  History reviewed. No pertinent past medical history.  There are no active problems to display for this patient.   Past Surgical History:  Procedure Laterality Date  . ABDOMINAL HYSTERECTOMY       OB History   No obstetric history on file.      Home Medications    Prior to Admission medications   Medication Sig Start Date End Date Taking? Authorizing Provider  benzonatate (TESSALON) 100 MG capsule Take 1 capsule (100 mg total) by mouth every 8 (eight) hours. 12/23/18   Dyon Rotert, Waylan BogaAlexandra M, PA-C  cephALEXin (KEFLEX) 500 MG capsule Take 1 capsule (500 mg total) by mouth 4 (four) times daily. 12/25/13   Pisciotta, Joni ReiningNicole, PA-C  furosemide (LASIX) 20 MG tablet Take 1 tablet (20 mg total) by mouth daily as needed. For foot swelling 06/10/13   Le, Thao P, DO  HYDROcodone-acetaminophen (NORCO/VICODIN) 5-325 MG per tablet Take 1 tablet by mouth every 6 (six) hours as needed for severe pain. 12/27/13   Earley FavorSchulz, Gail, NP    Family History Family History  Problem Relation Age of Onset  . Cancer Other     Social History Social History   Tobacco Use  . Smoking status: Never Smoker  .  Smokeless tobacco: Never Used  Substance Use Topics  . Alcohol use: Yes  . Drug use: No     Allergies   Patient has no known allergies.   Review of Systems Review of Systems  Constitutional: Negative for chills and fever.  HENT: Negative for facial swelling and sore throat.   Respiratory: Positive for cough and shortness of breath.   Cardiovascular: Positive for chest pain.  Gastrointestinal: Positive for abdominal pain. Negative for nausea and vomiting.  Genitourinary: Negative for dysuria.  Musculoskeletal: Positive for back pain.  Skin: Negative for rash and wound.  Neurological: Negative for headaches.  Psychiatric/Behavioral: The patient is not nervous/anxious.      Physical Exam Updated Vital Signs BP (!) 151/96 (BP Location: Right Arm)   Pulse 70   Temp 98.8 F (37.1 C) (Oral)   Resp 18   Ht 5\' 5"  (1.651 m)   Wt 87.5 kg   SpO2 97%   BMI 32.12 kg/m   Physical Exam Vitals signs and nursing note reviewed.  Constitutional:      General: She is not in acute distress.    Appearance: She is well-developed. She is not diaphoretic.  HENT:     Head: Normocephalic and atraumatic.     Mouth/Throat:     Pharynx: No oropharyngeal exudate.  Eyes:     General: No scleral icterus.       Right  eye: No discharge.        Left eye: No discharge.     Conjunctiva/sclera: Conjunctivae normal.     Pupils: Pupils are equal, round, and reactive to light.  Neck:     Musculoskeletal: Normal range of motion and neck supple.     Thyroid: No thyromegaly.  Cardiovascular:     Rate and Rhythm: Normal rate and regular rhythm.     Heart sounds: Normal heart sounds. No murmur. No friction rub. No gallop.   Pulmonary:     Effort: Pulmonary effort is normal. No respiratory distress.     Breath sounds: Normal breath sounds. No stridor. No wheezing or rales.  Chest:     Chest wall: No tenderness.  Abdominal:     General: Bowel sounds are normal. There is no distension.      Palpations: Abdomen is soft.     Tenderness: There is no abdominal tenderness. There is no guarding or rebound.  Musculoskeletal:     Comments: No tenderness on palpation of the back  Lymphadenopathy:     Cervical: No cervical adenopathy.  Skin:    General: Skin is warm and dry.     Coloration: Skin is not pale.     Findings: No rash.  Neurological:     Mental Status: She is alert.     Coordination: Coordination normal.      ED Treatments / Results  Labs (all labs ordered are listed, but only abnormal results are displayed) Labs Reviewed  COMPREHENSIVE METABOLIC PANEL - Abnormal; Notable for the following components:      Result Value   Creatinine, Ser 1.09 (*)    GFR calc non Af Amer 59 (*)    All other components within normal limits  LIPASE, BLOOD  CBC WITH DIFFERENTIAL/PLATELET    EKG EKG Interpretation  Date/Time:  Wednesday December 23 2018 12:27:58 EST Ventricular Rate:  69 PR Interval:    QRS Duration: 68 QT Interval:  411 QTC Calculation: 441 R Axis:   77 Text Interpretation: Sinus rhythm No old tracing to compare Confirmed by Aletta Edouard [phone]) on 12/23/2018 12:30:33 PM   Radiology Dg Chest Portable 1 View  Result Date: 12/23/2018 CLINICAL DATA:  Shortness of breath and cough.  COVID-19. EXAM: PORTABLE CHEST 1 VIEW COMPARISON:  12/12/2018 FINDINGS: The heart size and mediastinal contours are within normal limits. Both lungs are clear. The visualized skeletal structures are unremarkable. IMPRESSION: Normal exam. Electronically Signed   By: Lorriane Shire M.D.   On: 12/23/2018 12:56    Procedures Procedures (including critical care time)  Medications Ordered in ED Medications - No data to display   Initial Impression / Assessment and Plan / ED Course  I have reviewed the triage vital signs and the nursing notes.  Pertinent labs & imaging results that were available during my care of the patient were reviewed by me and considered in my medical  decision making (see chart for details).        Patient with known COVID-19 presenting with shortness of breath, cough, and back pain.  She reports her fianc has had Covid 2 and was here last night and diagnosed with viral pneumonia.  She wanted to be sure this was not the case.  She has not been taking anything other than Tylenol at home for symptoms.  Chest x-ray is clear.  Labs are unremarkable except for mild elevated creatinine.  Patient advised to drink more water.  Patient ambulated in the room at 96  to 98% on room air.  Will discharge home with Tessalon.  Suspect back pain more musculoskeletal, related to the cough.  There is no focal abdominal tenderness indicating imaging today.  Return precautions discussed.  Patient understands and agrees with plan.  Patient discharged in satisfactory condition.  Advised recheck of blood pressure at next PCP visit as it was elevated today.  Final Clinical Impressions(s) / ED Diagnoses   Final diagnoses:  COVID-19 virus infection    ED Discharge Orders         Ordered    benzonatate (TESSALON) 100 MG capsule  Every 8 hours     12/23/18 1341           Emi Holes, PA-C 12/23/18 1343    Terrilee Files, MD 12/23/18 1820

## 2018-12-23 NOTE — Discharge Instructions (Signed)
Take Tessalon every 8 hours as needed for cough.  You can alternate with ibuprofen and for pain or headaches.  Use ice and heat on your back alternating 20 minutes on, 20 minutes off.  Your work-up was very reassuring today.  Try to drink more water.  Please return to the emergency department you develop worsening shortness of breath, chest pain, back pain, passing out, or any other concerning symptom.  Please have your blood pressure rechecked at your primary care provider at next visit, as it was a little elevated today.

## 2018-12-23 NOTE — ED Notes (Signed)
Pt O2 sat. Stayed between 96-98% RA while ambulating in room, ED PA made aware, will continue to monitor.

## 2018-12-23 NOTE — ED Triage Notes (Signed)
Pt reports that she has known positive Covid, c/o cough and concern for pneumonia. Denies fevers.

## 2018-12-23 NOTE — ED Notes (Signed)
ED Provider at bedside. 

## 2018-12-28 ENCOUNTER — Telehealth: Payer: BC Managed Care – PPO | Admitting: Physician Assistant

## 2018-12-28 DIAGNOSIS — Z8616 Personal history of COVID-19: Secondary | ICD-10-CM

## 2018-12-28 DIAGNOSIS — Z8619 Personal history of other infectious and parasitic diseases: Secondary | ICD-10-CM

## 2018-12-28 NOTE — Progress Notes (Signed)
I am happy to provider you with a work note saying that you were advised to quarantine, but if you need to have paperwork filled out for your job you will need to contact your primary care provider to complete that for you.   Please contact your primary care physician practice to be seen. Many offices offer virtual options to be seen via video if you are not comfortable going in person to a medical facility at this time.  If you are having a true medical emergency please call 911.  NOTE: If you entered your credit card information for this eVisit, you will not be charged. You may see a "hold" on your card for the $35 but that hold will drop off and you will not have a charge processed.  Your e-visit answers were reviewed by a board certified advanced clinical practitioner to complete your personal care plan.  Thank you for using e-Visits.  Approximately 5 minutes was spent documenting and reviewing patient's chart.

## 2018-12-29 ENCOUNTER — Telehealth (HOSPITAL_BASED_OUTPATIENT_CLINIC_OR_DEPARTMENT_OTHER): Payer: Self-pay | Admitting: Emergency Medicine

## 2019-01-01 DIAGNOSIS — U071 COVID-19: Secondary | ICD-10-CM | POA: Diagnosis not present

## 2019-02-25 ENCOUNTER — Emergency Department (HOSPITAL_BASED_OUTPATIENT_CLINIC_OR_DEPARTMENT_OTHER)
Admission: EM | Admit: 2019-02-25 | Discharge: 2019-02-25 | Disposition: A | Discharge status: Home / Self Care | Payer: BC Managed Care – PPO | Attending: Emergency Medicine | Admitting: Emergency Medicine

## 2019-02-25 ENCOUNTER — Encounter (HOSPITAL_BASED_OUTPATIENT_CLINIC_OR_DEPARTMENT_OTHER): Payer: Self-pay

## 2019-02-25 ENCOUNTER — Other Ambulatory Visit: Payer: Self-pay

## 2019-02-25 DIAGNOSIS — S90452A Superficial foreign body, left great toe, initial encounter: Secondary | ICD-10-CM

## 2019-02-25 DIAGNOSIS — Z23 Encounter for immunization: Secondary | ICD-10-CM | POA: Diagnosis not present

## 2019-02-25 DIAGNOSIS — Z1881 Retained glass fragments: Secondary | ICD-10-CM | POA: Insufficient documentation

## 2019-02-25 DIAGNOSIS — L923 Foreign body granuloma of the skin and subcutaneous tissue: Secondary | ICD-10-CM | POA: Insufficient documentation

## 2019-02-25 MED ORDER — LIDOCAINE HCL (PF) 1 % IJ SOLN
2.0000 mL | Freq: Once | INTRAMUSCULAR | Status: AC
  Administered 2019-02-25: 2 mL
  Filled 2019-02-25: qty 5

## 2019-02-25 MED ORDER — LIDOCAINE-EPINEPHRINE-TETRACAINE (LET) TOPICAL GEL
3.0000 mL | Freq: Once | TOPICAL | Status: AC
  Administered 2019-02-25: 3 mL via TOPICAL
  Filled 2019-02-25: qty 3

## 2019-02-25 MED ORDER — TETANUS-DIPHTH-ACELL PERTUSSIS 5-2.5-18.5 LF-MCG/0.5 IM SUSP
INTRAMUSCULAR | Status: AC
  Administered 2019-02-25: 1 mL via INTRAMUSCULAR
  Filled 2019-02-25: qty 0.5

## 2019-02-25 NOTE — Discharge Instructions (Addendum)
Soak your toe as frequently as possible for the next several days.   Return to the ER if you develop redness, pus draining from the wound, or worsening pain.

## 2019-02-25 NOTE — ED Triage Notes (Signed)
Pt states she stepped on broken glass yesterday-states she feels there is glass bottom left great toe-NAD-steady gait

## 2019-02-25 NOTE — ED Notes (Signed)
ED Provider at bedside. 

## 2019-02-25 NOTE — ED Provider Notes (Signed)
[facility] EMERGENCY DEPARTMENT Provider Note   CSN: [id number] Arrival date & time: 02/25/19  1716     History Chief Complaint  Patient presents with  . Foreign Body in Kara Hoover is a 49 y.o. female.  Patient is a 49 year old female presenting with complaints of foreign body in her foot.  Patient states she stepped on broken glass yesterday and feels as though there is a shard of glass remaining.  Last tetanus shot unknown.  The history is provided by the patient.       History reviewed. No pertinent past medical history.  There are no problems to display for this patient.   Past Surgical History:  Procedure Laterality Date  . ABDOMINAL HYSTERECTOMY       OB History   No obstetric history on file.     Family History  Problem Relation Age of Onset  . Cancer Other     Social History   Tobacco Use  . Smoking status: Never Smoker  . Smokeless tobacco: Never Used  Substance Use Topics  . Alcohol use: Not Currently  . Drug use: No    Home Medications Prior to Admission medications   Medication Sig Start Date End Date Taking? Authorizing Provider  benzonatate (TESSALON) 100 MG capsule Take 1 capsule (100 mg total) by mouth every 8 (eight) hours. 12/23/18   Law, Bea Graff, PA-C  cephALEXin (KEFLEX) 500 MG capsule Take 1 capsule (500 mg total) by mouth 4 (four) times daily. 12/25/13   Pisciotta, Elmyra Ricks, PA-C  furosemide (LASIX) 20 MG tablet Take 1 tablet (20 mg total) by mouth daily as needed. For foot swelling 06/10/13   Le, Thao P, DO  HYDROcodone-acetaminophen (NORCO/VICODIN) 5-325 MG per tablet Take 1 tablet by mouth every 6 (six) hours as needed for severe pain. 12/27/13   Junius Creamer, NP    Allergies    Patient has no known allergies.  Review of Systems   Review of Systems  All other systems reviewed and are negative.   Physical Exam Updated Vital Signs BP (!) 143/80 (BP Location: Left Arm)   Pulse 74   Temp 98.6  F (37 C) (Oral)   Resp 14   Ht 5\' 5"  (1.651 m)   Wt 86.4 kg   SpO2 100%   BMI 31.70 kg/m   Physical Exam Vitals and nursing note reviewed.  Constitutional:      General: She is not in acute distress.    Appearance: Normal appearance. She is not ill-appearing.  HENT:     Head: Normocephalic and atraumatic.  Pulmonary:     Effort: Pulmonary effort is normal.  Skin:    General: Skin is warm and dry.     Comments: There is a small puncture wound to the plantar aspect of the left great toe just distal to the MTP joint.  Neurological:     Mental Status: She is alert.     ED Results / Procedures / Treatments   Labs (all labs ordered are listed, but only abnormal results are displayed) Labs Reviewed - No data to display  EKG None  Radiology No results found.  Procedures Procedures (including critical care time)  Medications Ordered in ED Medications  lidocaine-EPINEPHrine-tetracaine (LET) topical gel (has no administration in time range)  lidocaine (PF) (XYLOCAINE) 1 % injection 2 mL (has no administration in time range)    ED Course  I have reviewed the triage vital signs and the nursing notes.  Pertinent labs & imaging results that were available during my care of the patient were reviewed by me and considered in my medical decision making (see chart for details).    MDM Rules/Calculators/A&P  Patient presenting with complaints of possible foreign body in the bottom of her left great toe.  She reports stepping on glass yesterday evening and removing a shard of glass, but still feels as though there is something there.  The area was prepped with Betadine after topical anesthesia with LET.  I attempted to remove a foreign body, however I could not definitively identify one.  At this point, patient's tetanus will be updated and she will be advised to soak and return as needed for any problems.  Final Clinical Impression(s) / ED Diagnoses Final diagnoses:  None     Rx / DC Orders ED Discharge Orders    None       Geoffery Lyons, MD 02/25/19 Ebony Cargo

## 2019-09-02 ENCOUNTER — Encounter (HOSPITAL_BASED_OUTPATIENT_CLINIC_OR_DEPARTMENT_OTHER): Payer: Self-pay | Admitting: *Deleted

## 2019-09-02 ENCOUNTER — Other Ambulatory Visit: Payer: Self-pay

## 2019-09-02 ENCOUNTER — Emergency Department (HOSPITAL_BASED_OUTPATIENT_CLINIC_OR_DEPARTMENT_OTHER): Payer: BC Managed Care – PPO

## 2019-09-02 ENCOUNTER — Emergency Department (HOSPITAL_BASED_OUTPATIENT_CLINIC_OR_DEPARTMENT_OTHER)
Admission: EM | Admit: 2019-09-02 | Discharge: 2019-09-03 | Disposition: A | Discharge status: Home / Self Care | Payer: BC Managed Care – PPO | Attending: Emergency Medicine | Admitting: Emergency Medicine

## 2019-09-02 DIAGNOSIS — R072 Precordial pain: Secondary | ICD-10-CM | POA: Insufficient documentation

## 2019-09-02 DIAGNOSIS — R002 Palpitations: Secondary | ICD-10-CM | POA: Diagnosis not present

## 2019-09-02 DIAGNOSIS — R079 Chest pain, unspecified: Secondary | ICD-10-CM | POA: Diagnosis not present

## 2019-09-02 LAB — BASIC METABOLIC PANEL
Anion gap: 9 (ref 5–15)
BUN: 17 mg/dL (ref 6–20)
CO2: 23 mmol/L (ref 22–32)
Calcium: 8.9 mg/dL (ref 8.9–10.3)
Chloride: 107 mmol/L (ref 98–111)
Creatinine, Ser: 1.06 mg/dL — ABNORMAL HIGH (ref 0.44–1.00)
GFR calc Af Amer: >60 mL/min (ref >60)
GFR calc non Af Amer: >60 mL/min (ref >60)
Glucose, Bld: 109 mg/dL — ABNORMAL HIGH (ref 70–99)
Potassium: 3.9 mmol/L (ref 3.5–5.1)
Sodium: 139 mmol/L (ref 135–145)

## 2019-09-02 LAB — CBC
HCT: 42.9 % (ref 36.0–46.0)
Hemoglobin: 13.9 g/dL (ref 12.0–15.0)
MCH: 30.5 pg (ref 26.0–34.0)
MCHC: 32.4 g/dL (ref 30.0–36.0)
MCV: 94.1 fL (ref 80.0–100.0)
Platelets: 206 10*3/uL (ref 150–400)
RBC: 4.56 MIL/uL (ref 3.87–5.11)
RDW: 11.9 % (ref 11.5–15.5)
WBC: 7.5 10*3/uL (ref 4.0–10.5)
nRBC: 0 % (ref 0.0–0.2)

## 2019-09-02 LAB — TROPONIN I (HIGH SENSITIVITY): Troponin I (High Sensitivity): 3 ng/L (ref <18)

## 2019-09-02 NOTE — ED Notes (Signed)
Jenny Resp. Attempted IV and pt. Reports no IV until radiology results are back and blood is definitely needed.

## 2019-09-02 NOTE — ED Notes (Signed)
Pt. Reports she had a tug like feeling in her chest today with no shortness of breath.    Pt. Reports at 2:30 this morning was the first episode and then at work periodically continued.

## 2019-09-02 NOTE — ED Triage Notes (Signed)
She has had palpitations on and off since early am. She also has a pain in her chest that comes and goes.

## 2019-09-02 NOTE — ED Provider Notes (Signed)
[facility] EMERGENCY DEPARTMENT Provider Note   CSN: [id number] Arrival date & time: 09/02/19  1934     History Chief Complaint  Patient presents with  . Palpitations    Kara Hoover is a 49 y.o. female.  Patient with the complaints of an unusual feeling in the left anterior chest.  Last just seconds.  Feels like a pulling sensation.  Started at about 2:30 in the morning.  Has continued throughout the day proximately every 30 minutes.  Has never lasted even as long as a minute.  Not associated with any other symptoms never had anything like this before.  Specifically no shortness of breath or nausea or vomiting.  No feeling like she is got a passout.  Past medical history is noncontributory other than having an abdominal hysterectomy.  No history of hypertension no history of coronary artery disease.  No history of any heart rhythm problems.  No new leg swelling.  Always has a little bit of swelling to the right ankle.  No changes.        History reviewed. No pertinent past medical history.  There are no problems to display for this patient.   Past Surgical History:  Procedure Laterality Date  . ABDOMINAL HYSTERECTOMY       OB History   No obstetric history on file.     Family History  Problem Relation Age of Onset  . Cancer Other     Social History   Tobacco Use  . Smoking status: Never Smoker  . Smokeless tobacco: Never Used  Vaping Use  . Vaping Use: Never used  Substance Use Topics  . Alcohol use: Not Currently  . Drug use: No    Home Medications Prior to Admission medications   Medication Sig Start Date End Date Taking? Authorizing Provider  benzonatate (TESSALON) 100 MG capsule Take 1 capsule (100 mg total) by mouth every 8 (eight) hours. 12/23/18   Law, Waylan Boga, PA-C  cephALEXin (KEFLEX) 500 MG capsule Take 1 capsule (500 mg total) by mouth 4 (four) times daily. 12/25/13   Pisciotta, Joni Reining, PA-C  furosemide (LASIX) 20 MG tablet  Take 1 tablet (20 mg total) by mouth daily as needed. For foot swelling 06/10/13   Le, Thao P, DO  HYDROcodone-acetaminophen (NORCO/VICODIN) 5-325 MG per tablet Take 1 tablet by mouth every 6 (six) hours as needed for severe pain. 12/27/13   Earley Favor, NP    Allergies    Patient has no known allergies.  Review of Systems   Review of Systems  Constitutional: Negative for chills and fever.  HENT: Negative for congestion, rhinorrhea and sore throat.   Eyes: Negative for visual disturbance.  Respiratory: Negative for cough and shortness of breath.   Cardiovascular: Positive for chest pain. Negative for leg swelling.  Gastrointestinal: Negative for abdominal pain, diarrhea, nausea and vomiting.  Genitourinary: Negative for dysuria.  Musculoskeletal: Negative for back pain and neck pain.  Skin: Negative for rash.  Neurological: Negative for dizziness, light-headedness and headaches.  Hematological: Does not bruise/bleed easily.  Psychiatric/Behavioral: Negative for confusion.    Physical Exam Updated Vital Signs BP 113/73   Pulse 68   Temp 98.3 F (36.8 C) (Oral)   Resp 18   Ht 1.651 m (5\' 5" )   Wt 86.2 kg   SpO2 99%   BMI 31.62 kg/m   Physical Exam Vitals and nursing note reviewed.  Constitutional:      General: She is not in acute distress.  Appearance: Normal appearance. She is well-developed.  HENT:     Head: Normocephalic and atraumatic.  Eyes:     Extraocular Movements: Extraocular movements intact.     Conjunctiva/sclera: Conjunctivae normal.     Pupils: Pupils are equal, round, and reactive to light.  Cardiovascular:     Rate and Rhythm: Normal rate and regular rhythm.     Heart sounds: No murmur heard.   Pulmonary:     Effort: Pulmonary effort is normal. No respiratory distress.     Breath sounds: Normal breath sounds.  Chest:     Chest wall: No tenderness.  Abdominal:     Palpations: Abdomen is soft.     Tenderness: There is no abdominal tenderness.    Musculoskeletal:        General: No swelling. Normal range of motion.     Cervical back: Normal range of motion and neck supple.  Skin:    General: Skin is warm and dry.  Neurological:     General: No focal deficit present.     Mental Status: She is alert and oriented to person, place, and time.     Cranial Nerves: No cranial nerve deficit.     Sensory: No sensory deficit.     Motor: No weakness.     ED Results / Procedures / Treatments   Labs (all labs ordered are listed, but only abnormal results are displayed) Labs Reviewed  BASIC METABOLIC PANEL - Abnormal; Notable for the following components:      Result Value   Glucose, Bld 109 (*)    Creatinine, Ser 1.06 (*)    All other components within normal limits  CBC  TROPONIN I (HIGH SENSITIVITY)  TROPONIN I (HIGH SENSITIVITY)    EKG EKG Interpretation  Date/Time:  Thursday September 02 2019 19:44:31 EDT Ventricular Rate:  76 PR Interval:  172 QRS Duration: 70 QT Interval:  372 QTC Calculation: 418 R Axis:   69 Text Interpretation: Normal sinus rhythm Normal ECG Confirmed by Vanetta Mulders [phone]) on 09/02/2019 10:12:30 PM   Radiology DG Chest Port 1 View  Result Date: 09/02/2019 CLINICAL DATA:  Chest pain EXAM: PORTABLE CHEST 1 VIEW COMPARISON:  12/23/2018 FINDINGS: The heart size and mediastinal contours are within normal limits. Both lungs are clear. The visualized skeletal structures are unremarkable. IMPRESSION: No active disease. Electronically Signed   By: Jasmine Pang M.D.   On: 09/02/2019 22:57    Procedures Procedures (including critical care time)  Medications Ordered in ED Medications - No data to display  ED Course  I have reviewed the triage vital signs and the nursing notes.  Pertinent labs & imaging results that were available during my care of the patient were reviewed by me and considered in my medical decision making (see chart for details).    MDM Rules/Calculators/A&P                          Patient had some of the events occur while I was in the room.  Cardiac monitor showed no arrhythmia.  Feel that this is more episodic very brief chest discomfort.  We'll have patient follow-up with cardiology.  Chest x-ray negative labs without significant abnormalities and initial troponin was 3.  Since this has been ongoing since 2:30 in the morning.  Do not feel that we need to do a delta troponin.  Patient has no significant cardiac risk factors.  We'll have patient follow-up with cardiology she will return  for any symptoms lasting 15 minutes or longer.  Final Clinical Impression(s) / ED Diagnoses Final diagnoses:  Precordial pain    Rx / DC Orders ED Discharge Orders    None       Vanetta Mulders, MD 09/03/19 [phone]

## 2019-09-03 NOTE — Discharge Instructions (Signed)
Work-up here today the labs and chest x-ray and cardiac monitoring without any significant abnormalities.  No evidence of any palpitations when you get the brief feelings of discomfort in the chest.  Initial troponin negative.  Return for any chest pain lasting 15 minutes or longer.  Make an appointment to follow-up with cardiology.

## 2019-11-29 DIAGNOSIS — F418 Other specified anxiety disorders: Secondary | ICD-10-CM | POA: Diagnosis not present

## 2019-12-14 DIAGNOSIS — F418 Other specified anxiety disorders: Secondary | ICD-10-CM | POA: Diagnosis not present

## 2020-06-09 ENCOUNTER — Other Ambulatory Visit: Payer: Self-pay

## 2020-06-09 ENCOUNTER — Ambulatory Visit
Admission: EM | Admit: 2020-06-09 | Discharge: 2020-06-09 | Disposition: A | Discharge status: Home / Self Care | Payer: BC Managed Care – PPO

## 2020-10-05 ENCOUNTER — Encounter (HOSPITAL_BASED_OUTPATIENT_CLINIC_OR_DEPARTMENT_OTHER): Payer: Self-pay | Admitting: *Deleted

## 2020-10-05 ENCOUNTER — Emergency Department (HOSPITAL_BASED_OUTPATIENT_CLINIC_OR_DEPARTMENT_OTHER)
Admission: EM | Admit: 2020-10-05 | Discharge: 2020-10-06 | Disposition: A | Discharge status: Home / Self Care | Payer: BC Managed Care – PPO | Attending: Emergency Medicine | Admitting: Emergency Medicine

## 2020-10-05 ENCOUNTER — Other Ambulatory Visit: Payer: Self-pay

## 2020-10-05 ENCOUNTER — Other Ambulatory Visit (HOSPITAL_BASED_OUTPATIENT_CLINIC_OR_DEPARTMENT_OTHER): Payer: Self-pay

## 2020-10-05 DIAGNOSIS — M549 Dorsalgia, unspecified: Secondary | ICD-10-CM | POA: Insufficient documentation

## 2020-10-05 DIAGNOSIS — U071 COVID-19: Secondary | ICD-10-CM | POA: Insufficient documentation

## 2020-10-05 DIAGNOSIS — R059 Cough, unspecified: Secondary | ICD-10-CM | POA: Diagnosis not present

## 2020-10-05 MED ORDER — CARESTART COVID-19 HOME TEST VI KIT
PACK | 0 refills | Status: DC
  Filled 2020-10-05: qty 4, 4d supply, fill #0

## 2020-10-05 NOTE — ED Triage Notes (Signed)
Covid + today, sx started today , chills fever, body aches, h/a

## 2020-10-06 MED ORDER — IBUPROFEN 400 MG PO TABS
600.0000 mg | ORAL_TABLET | Freq: Once | ORAL | Status: AC
  Administered 2020-10-06: 600 mg via ORAL
  Filled 2020-10-06: qty 1

## 2020-10-06 MED ORDER — NIRMATRELVIR/RITONAVIR (PAXLOVID)TABLET: 3.0000 | ORAL_TABLET | Freq: Two times a day (BID) | ORAL | 0 refills | Status: AC

## 2020-10-06 MED ORDER — BENZONATATE 100 MG PO CAPS: 100.0000 mg | ORAL_CAPSULE | Freq: Three times a day (TID) | ORAL | 0 refills | Status: DC

## 2020-10-06 NOTE — ED Provider Notes (Signed)
[facility] EMERGENCY DEPARTMENT Provider Note   CSN: [id number] Arrival date & time: 10/05/20  2218     History Chief Complaint  Patient presents with   Covid Positive    Kara Hoover is a 50 y.o. female.  The history is provided by the patient and medical records.  Kara Hoover is a 50 y.o. female who presents to the Emergency Department complaining of COVID-19. She states that her husband got sick and tested positive three days ago. Yesterday she developed a runny nose, chills, cough, fever and itchy throat. No labored breathing. She has been fully vaccinated for COVID-19 including a booster. She took a COVID antigen test today at home, which was positive.  No n/v, d, sob.  Has some back pain.  No chance of pregnancy.     History reviewed. No pertinent past medical history.  There are no problems to display for this patient.   Past Surgical History:  Procedure Laterality Date   ABDOMINAL HYSTERECTOMY       OB History   No obstetric history on file.     Family History  Problem Relation Age of Onset   Cancer Other     Social History   Tobacco Use   Smoking status: Never   Smokeless tobacco: Never  Vaping Use   Vaping Use: Never used  Substance Use Topics   Alcohol use: Not Currently   Drug use: No    Home Medications Prior to Admission medications   Medication Sig Start Date End Date Taking? Authorizing Provider  benzonatate (TESSALON) 100 MG capsule Take 1 capsule (100 mg total) by mouth every 8 (eight) hours. 10/06/20  Yes Quintella Reichert, MD  nirmatrelvir/ritonavir EUA (PAXLOVID) 20 x 150 MG & 10 x 100MG TABS Take 3 tablets by mouth 2 (two) times daily for 5 days. Patient GFR is >60 Take nirmatrelvir (150 mg) two tablets twice daily for 5 days and ritonavir (100 mg) one tablet twice daily for 5 days. 10/06/20 10/11/20 Yes Quintella Reichert, MD  cephALEXin (KEFLEX) 500 MG capsule Take 1 capsule (500 mg total) by mouth 4 (four) times daily.  12/25/13   Pisciotta, Elmyra Ricks, PA-C  COVID-19 At Home Antigen Test [facility] COVID-19 HOME TEST) KIT Use as directed per package instructions 10/05/20   Clementeen Graham, RPH  furosemide (LASIX) 20 MG tablet Take 1 tablet (20 mg total) by mouth daily as needed. For foot swelling 06/10/13   Le, Thao P, DO  HYDROcodone-acetaminophen (NORCO/VICODIN) 5-325 MG per tablet Take 1 tablet by mouth every 6 (six) hours as needed for severe pain. 12/27/13   Junius Creamer, NP    Allergies    Patient has no known allergies.  Review of Systems   Review of Systems  All other systems reviewed and are negative.  Physical Exam Updated Vital Signs BP (!) 163/99   Pulse (!) 101   Temp 99.5 F (37.5 C) (Oral)   Resp 16   Ht '5\' 5"'  (1.651 m)   Wt 93.4 kg   SpO2 97%   BMI 34.28 kg/m   Physical Exam Vitals and nursing note reviewed.  Constitutional:      Appearance: She is well-developed.  HENT:     Head: Normocephalic and atraumatic.  Cardiovascular:     Rate and Rhythm: Normal rate and regular rhythm.  Pulmonary:     Effort: Pulmonary effort is normal. No respiratory distress.  Musculoskeletal:        General: No tenderness.  Skin:  General: Skin is warm and dry.  Neurological:     Mental Status: She is alert and oriented to person, place, and time.  Psychiatric:        Behavior: Behavior normal.    ED Results / Procedures / Treatments   Labs (all labs ordered are listed, but only abnormal results are displayed) Labs Reviewed - No data to display  EKG None  Radiology No results found.  Procedures Procedures   Medications Ordered in ED Medications  ibuprofen (ADVIL) tablet 600 mg (has no administration in time range)    ED Course  I have reviewed the triage vital signs and the nursing notes.  Pertinent labs & imaging results that were available during my care of the patient were reviewed by me and considered in my medical decision making (see chart for details).    MDM  Rules/Calculators/A&P                          patient here for evaluation of viral symptoms. She has a positive COVID antigen test from home and her husband has recently tested positive as well. She is non-toxic appearing on evaluation with no respiratory distress. Records reviewed in care everywhere. She has a prior renal function in August 2021 with normal GFR. Discussed treatment options with oral antiviral medications. Discussed home care for COVID with return precautions.  Final Clinical Impression(s) / ED Diagnoses Final diagnoses:  COVID-19 virus infection    Rx / DC Orders ED Discharge Orders          Ordered    benzonatate (TESSALON) 100 MG capsule  Every 8 hours        10/06/20 0139    nirmatrelvir/ritonavir EUA (PAXLOVID) 20 x 150 MG & 10 x 100MG TABS  2 times daily        10/06/20 0139             Quintella Reichert, MD 10/06/20 [phone]

## 2021-04-12 IMAGING — CR DG CHEST 2V
2 series · 2 of 2 positions shown · non-contrast
Comparison: 02/08/2006

CLINICAL DATA: Cough

EXAM:
CHEST - 2 VIEW

[chest pa]
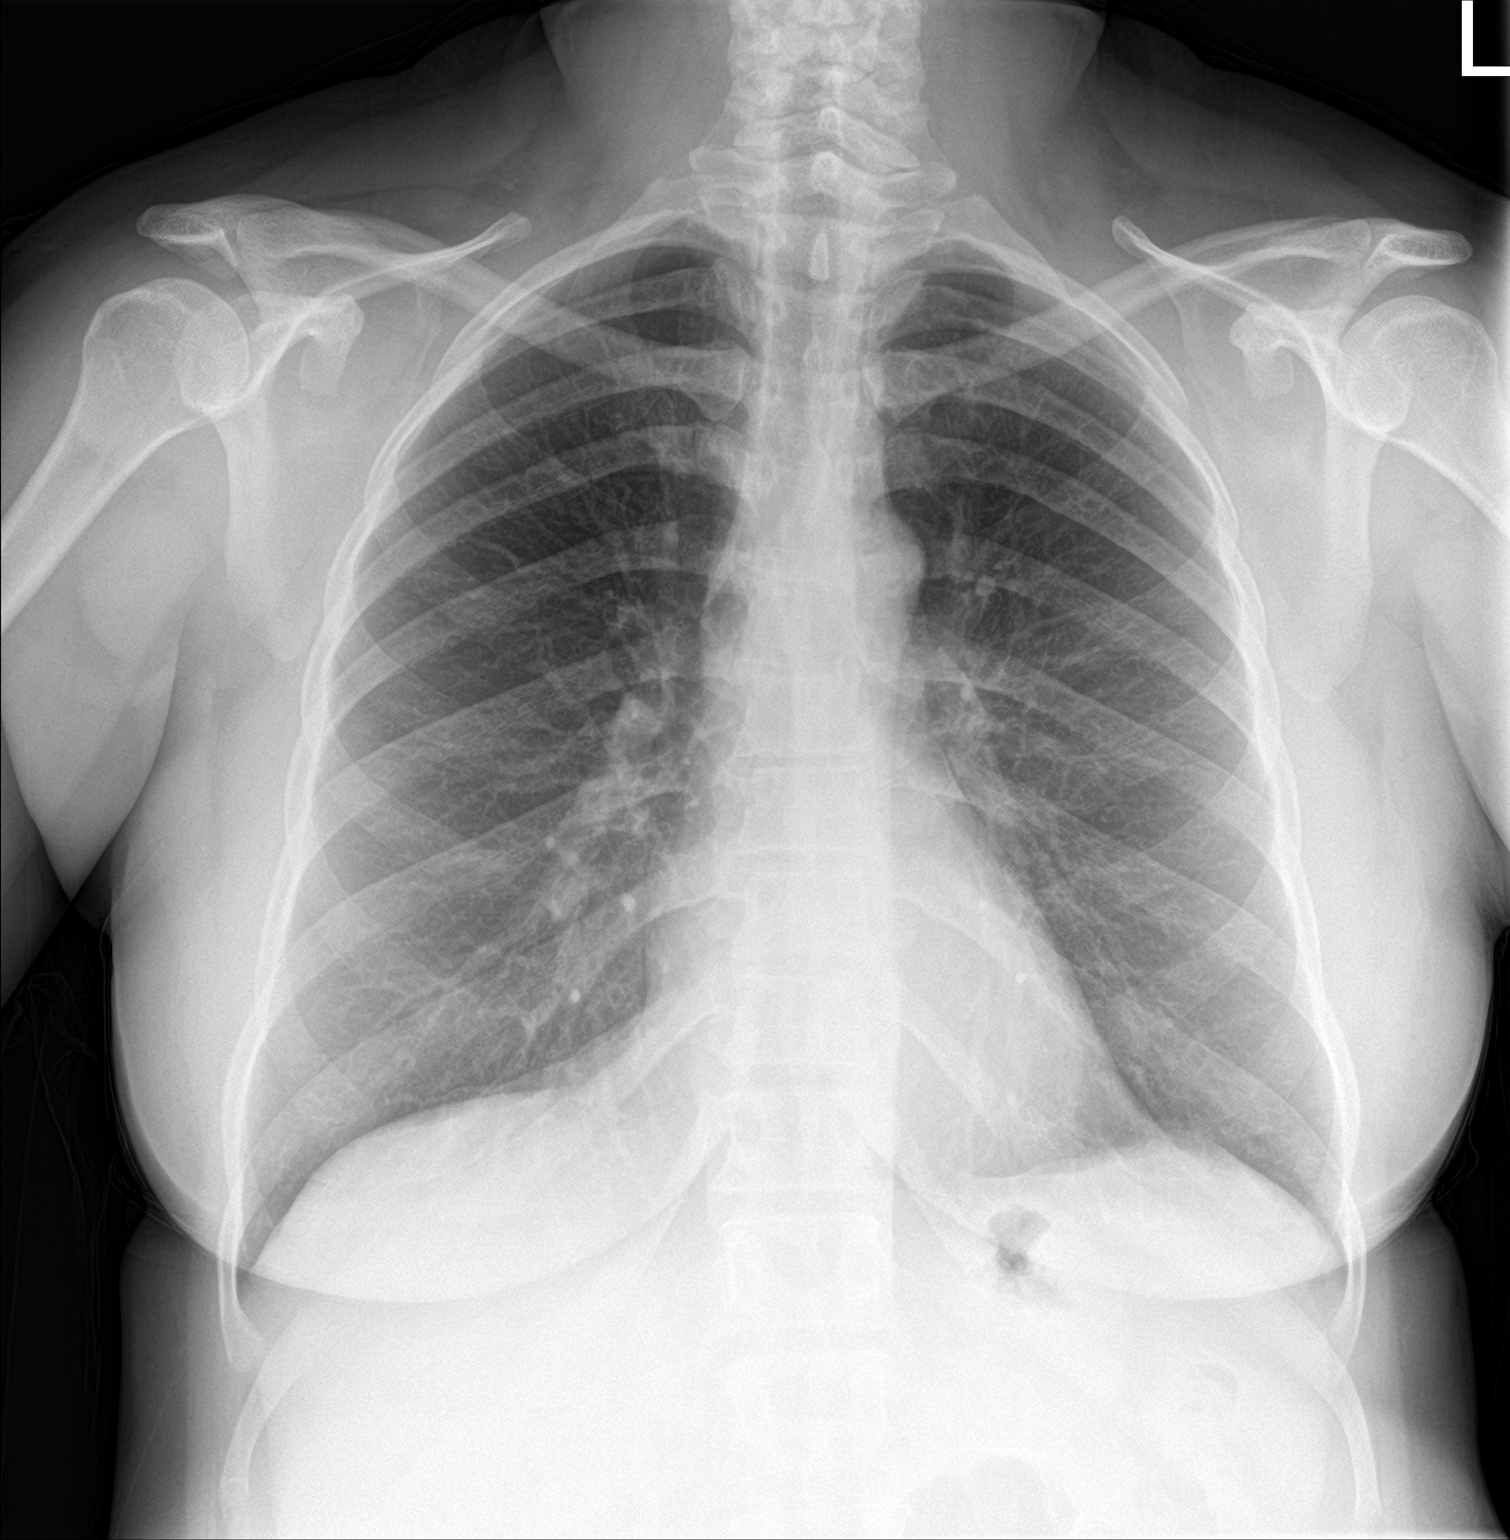

[chest lat]
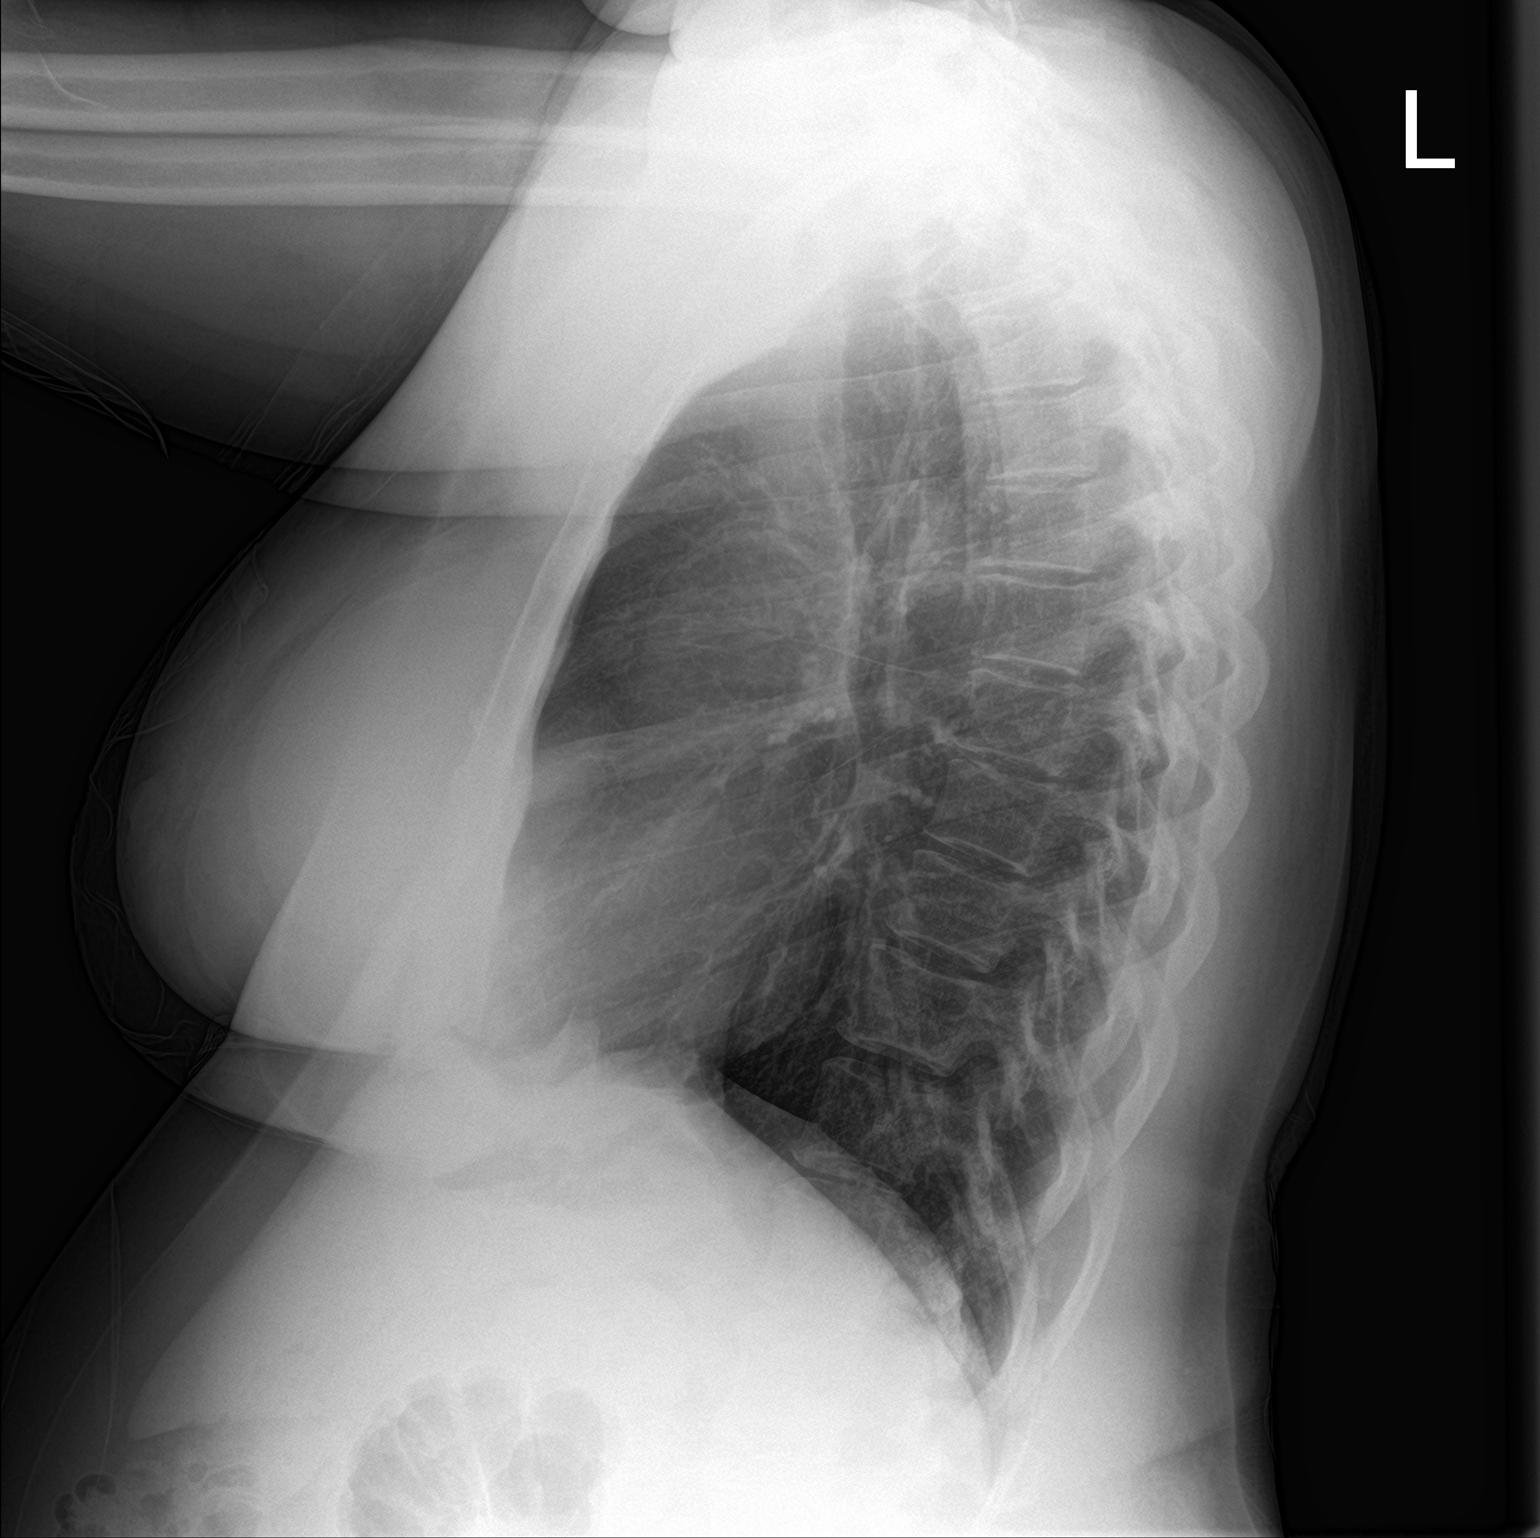

[2 of 2 positions shown; findings below may reference images not displayed]

FINDINGS: Heart and mediastinal contours are within normal limits. No focal
opacities or effusions. No acute bony abnormality.
IMPRESSION: No active cardiopulmonary disease.

## 2021-04-23 IMAGING — DX DG CHEST 1V PORT
1 series · 1 of 1 positions shown · non-contrast
Comparison: 12/12/2018

CLINICAL DATA: Shortness of breath and cough.  [id number].

EXAM:
PORTABLE CHEST 1 VIEW

[chest ap]
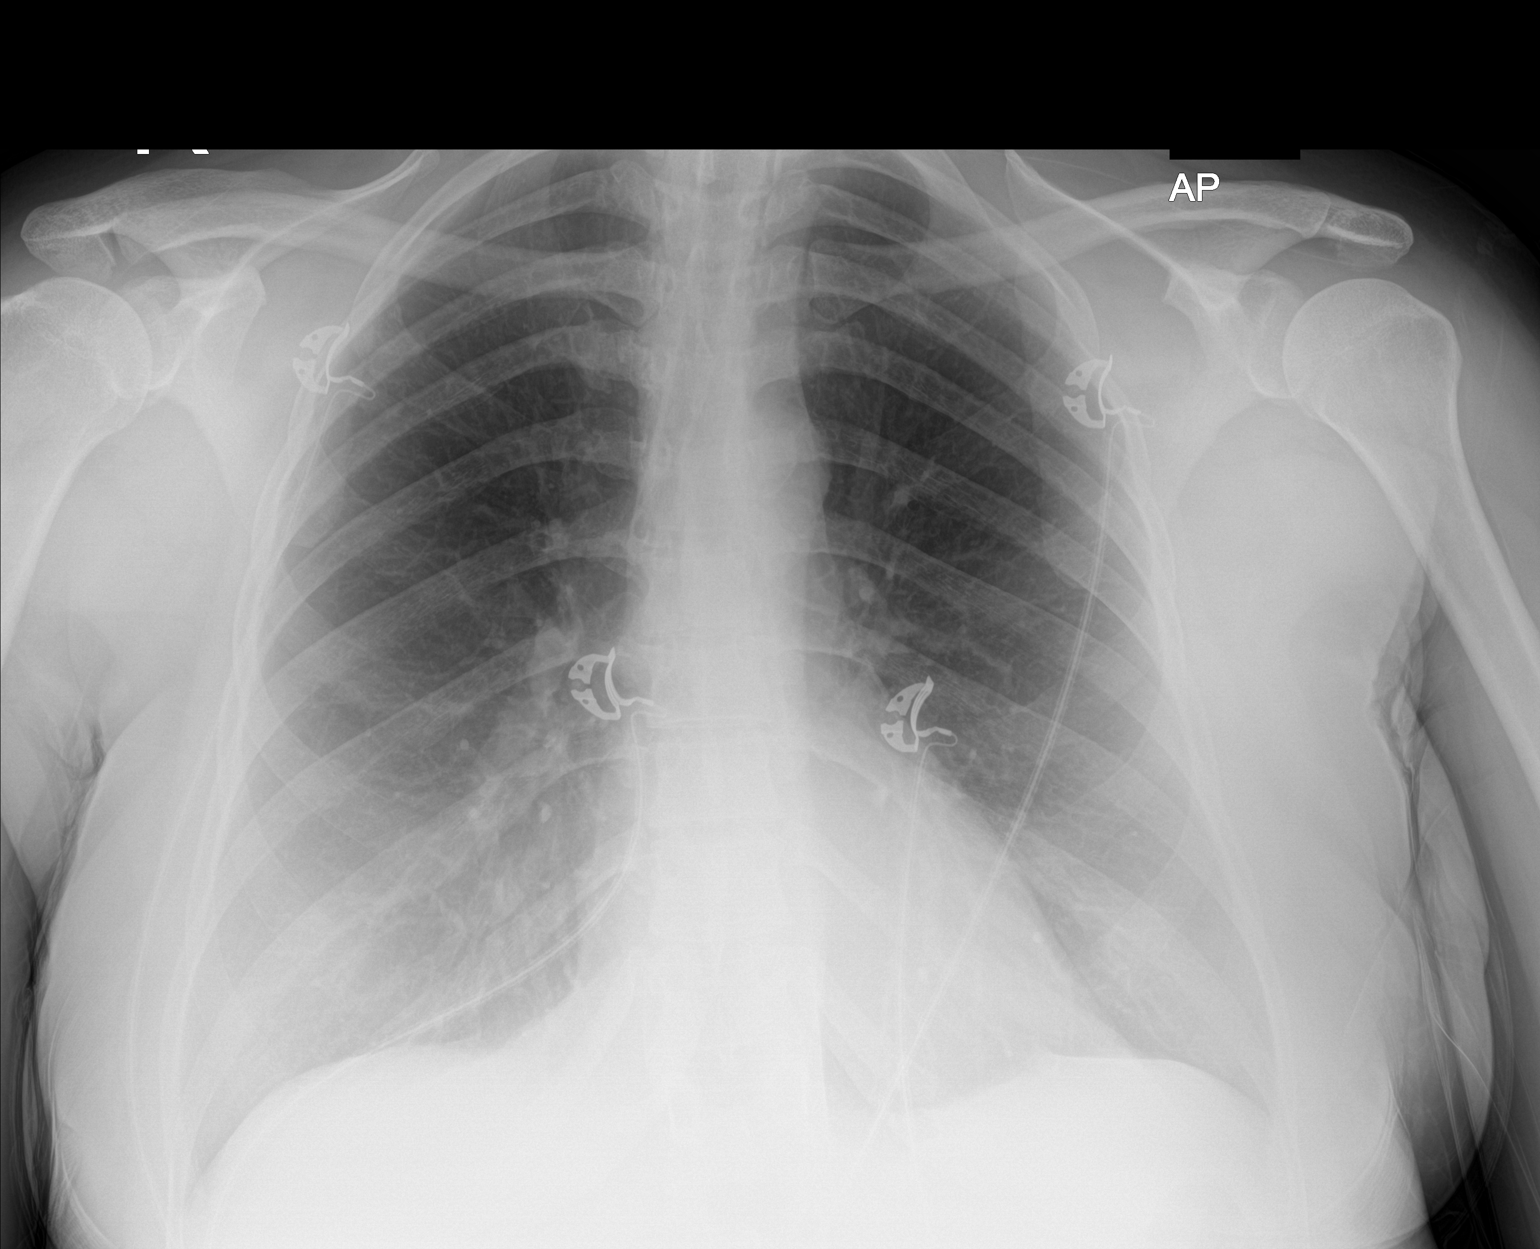

[1 of 1 positions shown; findings below may reference images not displayed]

FINDINGS: The heart size and mediastinal contours are within normal limits.
Both lungs are clear. The visualized skeletal structures are
unremarkable.
IMPRESSION: Normal exam.

## 2021-06-15 DIAGNOSIS — G8929 Other chronic pain: Secondary | ICD-10-CM | POA: Diagnosis not present

## 2021-06-15 DIAGNOSIS — Z13220 Encounter for screening for lipoid disorders: Secondary | ICD-10-CM | POA: Diagnosis not present

## 2021-06-15 DIAGNOSIS — E669 Obesity, unspecified: Secondary | ICD-10-CM | POA: Diagnosis not present

## 2021-06-15 DIAGNOSIS — M545 Low back pain, unspecified: Secondary | ICD-10-CM | POA: Diagnosis not present

## 2021-06-22 DIAGNOSIS — R739 Hyperglycemia, unspecified: Secondary | ICD-10-CM | POA: Diagnosis not present

## 2021-09-07 ENCOUNTER — Encounter: Payer: BC Managed Care – PPO | Admitting: Nurse Practitioner

## 2021-09-14 ENCOUNTER — Encounter: Payer: Self-pay | Admitting: Nurse Practitioner

## 2021-09-14 ENCOUNTER — Ambulatory Visit (INDEPENDENT_AMBULATORY_CARE_PROVIDER_SITE_OTHER): Payer: BC Managed Care – PPO | Admitting: Nurse Practitioner

## 2021-09-14 VITALS — BP 156/102 | HR 65 | Temp 97.2°F | Ht 65.0 in | Wt 212.2 lb

## 2021-09-14 DIAGNOSIS — E669 Obesity, unspecified: Secondary | ICD-10-CM | POA: Insufficient documentation

## 2021-09-14 DIAGNOSIS — F419 Anxiety disorder, unspecified: Secondary | ICD-10-CM | POA: Insufficient documentation

## 2021-09-14 DIAGNOSIS — E1165 Type 2 diabetes mellitus with hyperglycemia: Secondary | ICD-10-CM | POA: Diagnosis not present

## 2021-09-14 DIAGNOSIS — E559 Vitamin D deficiency, unspecified: Secondary | ICD-10-CM

## 2021-09-14 DIAGNOSIS — Z6835 Body mass index (BMI) 35.0-35.9, adult: Secondary | ICD-10-CM | POA: Diagnosis not present

## 2021-09-14 DIAGNOSIS — F526 Dyspareunia not due to a substance or known physiological condition: Secondary | ICD-10-CM | POA: Insufficient documentation

## 2021-09-14 DIAGNOSIS — R739 Hyperglycemia, unspecified: Secondary | ICD-10-CM

## 2021-09-14 DIAGNOSIS — I10 Essential (primary) hypertension: Secondary | ICD-10-CM | POA: Diagnosis not present

## 2021-09-14 DIAGNOSIS — F324 Major depressive disorder, single episode, in partial remission: Secondary | ICD-10-CM | POA: Insufficient documentation

## 2021-09-14 DIAGNOSIS — F339 Major depressive disorder, recurrent, unspecified: Secondary | ICD-10-CM | POA: Insufficient documentation

## 2021-09-14 DIAGNOSIS — G4700 Insomnia, unspecified: Secondary | ICD-10-CM | POA: Insufficient documentation

## 2021-09-14 DIAGNOSIS — G8929 Other chronic pain: Secondary | ICD-10-CM | POA: Insufficient documentation

## 2021-09-14 LAB — CBC
HCT: 41 % (ref 36.0–46.0)
Hemoglobin: 13.5 g/dL (ref 12.0–15.0)
MCHC: 32.8 g/dL (ref 30.0–36.0)
MCV: 93.5 fl (ref 78.0–100.0)
Platelets: 203 10*3/uL (ref 150.0–400.0)
RBC: 4.38 Mil/uL (ref 3.87–5.11)
RDW: 13 % (ref 11.5–15.5)
WBC: 5.2 10*3/uL (ref 4.0–10.5)

## 2021-09-14 LAB — LIPID PANEL
Cholesterol: 186 mg/dL (ref 0–200)
HDL: 38.4 mg/dL — ABNORMAL LOW (ref >39.00)
LDL Cholesterol: 119 mg/dL — ABNORMAL HIGH (ref 0–99)
NonHDL: 147.21
Total CHOL/HDL Ratio: 5
Triglycerides: 139 mg/dL (ref 0.0–149.0)
VLDL: 27.8 mg/dL (ref 0.0–40.0)

## 2021-09-14 LAB — COMPREHENSIVE METABOLIC PANEL
ALT: 16 U/L (ref 0–35)
AST: 15 U/L (ref 0–37)
Albumin: 4.2 g/dL (ref 3.5–5.2)
Alkaline Phosphatase: 106 U/L (ref 39–117)
BUN: 9 mg/dL (ref 6–23)
CO2: 27 mEq/L (ref 19–32)
Calcium: 9.2 mg/dL (ref 8.4–10.5)
Chloride: 107 mEq/L (ref 96–112)
Creatinine, Ser: 0.97 mg/dL (ref 0.40–1.20)
GFR: 67.04 mL/min (ref >60.00)
Glucose, Bld: 118 mg/dL — ABNORMAL HIGH (ref 70–99)
Potassium: 4 mEq/L (ref 3.5–5.1)
Sodium: 141 mEq/L (ref 135–145)
Total Bilirubin: 0.4 mg/dL (ref 0.2–1.2)
Total Protein: 7.2 g/dL (ref 6.0–8.3)

## 2021-09-14 LAB — VITAMIN D 25 HYDROXY (VIT D DEFICIENCY, FRACTURES): VITD: <7 ng/mL — ABNORMAL LOW (ref 30.00–100.00)

## 2021-09-14 LAB — HEMOGLOBIN A1C: Hgb A1c MFr Bld: 7.7 % — ABNORMAL HIGH (ref 4.6–6.5)

## 2021-09-14 LAB — TSH: TSH: 1.96 u[IU]/mL (ref 0.35–5.50)

## 2021-09-14 MED ORDER — VALSARTAN 80 MG PO TABS: 80.0000 mg | ORAL_TABLET | Freq: Every day | ORAL | 5 refills | Status: DC

## 2021-09-14 NOTE — Progress Notes (Signed)
New patient visit  Patient: Kara Hoover   DOB: 1970/10/16   51 y.o. Female  MRN: [id number] Visit Date: 09/16/2021  Subjective:    Chief Complaint  Patient presents with   Establish Care    New Pt, Est Care Weight Loss options Would like blood work done, pt fasting  Requesting records for PAP    Kara Hoover is a 51 y.o. female who presents today as a new patient to establish care.  HPI  Previous pcp [facility]: Dr. Kelton Pillar   Most recent depression screenings:    09/14/2021   12:14 PM  PHQ 2/9 Scores  PHQ - 2 Score 0  PHQ- 9 Score 8   Type 2 diabetes mellitus with hyperglycemia, without long-term current use of insulin (HCC) New diagnosis Elevated glucose and hgbA1c: 7.7% indicates type 2 diabetes. Start metformin 554m BID and you will be contacted to schedule appt with nutritionist. Abnormal lipid panel:need heart healthy diet and daily exercise. F/up in 365month  Vitamin D deficiency Low vit. D: start 50000IU weekly x [id number] switch to 5000IU daily OTC  Obesity Weight gain despite diet modification: increase protein and fiber intake, decrease portion size. Following [employer] soothie cleanse. Works from home. Gym:3x/week, treadmil, walking 1hr 3x/week. Lowest weight:135lbs in 2017 Heaviest weight: 220lbs 55mo455monthdmits to stress eating due to financial strain and elderly parent's poor heath. Wt Readings from Last 3 Encounters:  09/14/21 212 lb 3.2 oz (96.3 kg)  10/05/20 206 lb (93.4 kg)  09/02/19 190 lb (86.2 kg)   Entered referral to nutritionist.  History reviewed. No pertinent past medical history. Past Surgical History:  Procedure Laterality Date   ABDOMINAL HYSTERECTOMY     TUMOR REMOVAL     Social History   Tobacco Use   Smoking status: Never   Smokeless tobacco: Never  Vaping Use   Vaping Use: Never used  Substance Use Topics   Alcohol use: Not Currently   Drug use: No   Family History  Problem  Relation Age of Onset   Fibromyalgia Mother    Hypertension Father    Stroke Father    Cancer Other          12/23/2018   12:32 PM 02/25/2019    5:23 PM 09/02/2019    7:44 PM 10/05/2020   10:30 PM  Fall Risk  Patient Fall Risk Level Low fall risk Low fall risk Low fall risk Low fall risk   Outpatient Medications Prior to Visit  Medication Sig   [DISCONTINUED] benzonatate (TESSALON) 100 MG capsule Take 1 capsule (100 mg total) by mouth every 8 (eight) hours. (Patient not taking: Reported on 09/14/2021)   [DISCONTINUED] cephALEXin (KEFLEX) 500 MG capsule Take 1 capsule (500 mg total) by mouth 4 (four) times daily. (Patient not taking: Reported on 09/14/2021)   [DISCONTINUED] COVID-19 At Home Antigen Test ([facility] COVID-19 HOME TEST) KIT Use as directed per package instructions (Patient not taking: Reported on 09/14/2021)   [DISCONTINUED] furosemide (LASIX) 20 MG tablet Take 1 tablet (20 mg total) by mouth daily as needed. For foot swelling (Patient not taking: Reported on 09/14/2021)   [DISCONTINUED] HYDROcodone-acetaminophen (NORCO/VICODIN) 5-325 MG per tablet Take 1 tablet by mouth every 6 (six) hours as needed for severe pain. (Patient not taking: Reported on 09/14/2021)   No facility-administered medications prior to visit.   No Known Allergies  Patient Care Team: GrifKelton Pillar as PCP - General (Family Medicine) Gynecology, [facility]  Review of Systems     Objective:  BP (!) 156/102 (BP Location: Right Arm, Patient Position: Sitting, Cuff Size: Normal)   Pulse 65   Temp (!) 97.2 F (36.2 C) (Temporal)   Ht _0  (1.651 m)   Wt 212 lb 3.2 oz (96.3 kg)   SpO2 97%   BMI 35.31 kg/m     BP Readings from Last 3 Encounters:  09/14/21 (!) 156/102  10/06/20 118/90  09/03/19 118/81   Wt Readings from Last 3 Encounters:  09/14/21 212 lb 3.2 oz (96.3 kg)  10/05/20 206 lb (93.4 kg)  09/02/19 190 lb (86.2 kg)   Physical Exam Vitals  reviewed.  Constitutional:      Appearance: She is obese.  Cardiovascular:     Rate and Rhythm: Normal rate and regular rhythm.     Pulses: Normal pulses.     Heart sounds: Normal heart sounds.  Pulmonary:     Effort: Pulmonary effort is normal.     Breath sounds: Normal breath sounds.  Musculoskeletal:     Right lower leg: No edema.     Left lower leg: No edema.  Neurological:     Mental Status: She is alert and oriented to person, place, and time.  Psychiatric:        Mood and Affect: Mood normal.        Behavior: Behavior normal.        Thought Content: Thought content normal.     Results for orders placed or performed in visit on 09/14/21  CBC  Result Value Ref Range   WBC 5.2 4.0 - 10.5 K/uL   RBC 4.38 3.87 - 5.11 Mil/uL   Platelets 203.0 150.0 - 400.0 K/uL   Hemoglobin 13.5 12.0 - 15.0 g/dL   HCT 41.0 36.0 - 46.0 %   MCV 93.5 78.0 - 100.0 fl   MCHC 32.8 30.0 - 36.0 g/dL   RDW 13.0 11.5 - 15.5 %  Comprehensive metabolic panel  Result Value Ref Range   Sodium 141 135 - 145 mEq/L   Potassium 4.0 3.5 - 5.1 mEq/L   Chloride 107 96 - 112 mEq/L   CO2 27 19 - 32 mEq/L   Glucose, Bld 118 (H) 70 - 99 mg/dL   BUN 9 6 - 23 mg/dL   Creatinine, Ser 0.97 0.40 - 1.20 mg/dL   Total Bilirubin 0.4 0.2 - 1.2 mg/dL   Alkaline Phosphatase 106 39 - 117 U/L   AST 15 0 - 37 U/L   ALT 16 0 - 35 U/L   Total Protein 7.2 6.0 - 8.3 g/dL   Albumin 4.2 3.5 - 5.2 g/dL   GFR 67.04 >60.00 mL/min   Calcium 9.2 8.4 - 10.5 mg/dL  Lipid panel  Result Value Ref Range   Cholesterol 186 0 - 200 mg/dL   Triglycerides 139.0 0.0 - 149.0 mg/dL   HDL 38.40 (L) >39.00 mg/dL   VLDL 27.8 0.0 - 40.0 mg/dL   LDL Cholesterol 119 (H) 0 - 99 mg/dL   Total CHOL/HDL Ratio 5    NonHDL 147.21   Hemoglobin A1c  Result Value Ref Range   Hgb A1c MFr Bld 7.7 (H) 4.6 - 6.5 %  Vitamin D (25 hydroxy)  Result Value Ref Range   VITD <7.00 (L) 30.00 - 100.00 ng/mL  TSH  Result Value Ref Range   TSH 1.96 0.35 -  5.50 uIU/mL      Assessment & Plan:    Problem List Items Addressed This  Visit       Endocrine   Type 2 diabetes mellitus with hyperglycemia, without long-term current use of insulin (HCC)    New diagnosis Elevated glucose and hgbA1c: 7.7% indicates type 2 diabetes. Start metformin 511m BID and you will be contacted to schedule appt with nutritionist. Abnormal lipid panel:need heart healthy diet and daily exercise. F/up in 361month      Relevant Medications   valsartan (DIOVAN) 80 MG tablet   metFORMIN (GLUCOPHAGE) 500 MG tablet   Other Relevant Orders   Hemoglobin A1c (Completed)   Referral to Nutrition and Diabetes Services     Other   Obesity    Weight gain despite diet modification: increase protein and fiber intake, decrease portion size. Following [employer] soothie cleanse. Works from home. Gym:3x/week, treadmil, walking 1hr 3x/week. Lowest weight:135lbs in 2017 Heaviest weight: 220lbs 67m267monthAdmits to stress eating due to financial strain and elderly parent's poor heath. Wt Readings from Last 3 Encounters:  09/14/21 212 lb 3.2 oz (96.3 kg)  10/05/20 206 lb (93.4 kg)  09/02/19 190 lb (86.2 kg)  Entered referral to nutritionist.      Relevant Medications   metFORMIN (GLUCOPHAGE) 500 MG tablet   Other Relevant Orders   Comprehensive metabolic panel (Completed)   Lipid panel (Completed)   Hemoglobin A1c (Completed)   Vitamin D deficiency    Low vit. D: start 50000IU weekly x 53mo68monthhen switch to 5000IU daily OTC      Relevant Medications   Vitamin D, Ergocalciferol, (DRISDOL) 1.25 MG (50000 UNIT) CAPS capsule   Other Relevant Orders   Vitamin D (25 hydroxy) (Completed)   Other Visit Diagnoses     Primary hypertension    -  Primary   Relevant Medications   valsartan (DIOVAN) 80 MG tablet   Other Relevant Orders   CBC (Completed)   Comprehensive metabolic panel (Completed)   TSH (Completed)      Return in about 4 weeks (around 10/12/2021) for  HTN, Weight management.      CharWilfred Lacy

## 2021-09-14 NOTE — Patient Instructions (Addendum)
Thank you for choosing [facility]  Go to lab for blood draw  Sign medical release form to get your records from previous providers.  Start valsartan for hypertension. You will be contacted to schedule appt with nutritionist.  Hypertension, Adult Hypertension is another name for high blood pressure. High blood pressure forces your heart to work harder to pump blood. This can cause problems over time. There are two numbers in a blood pressure reading. There is a top number (systolic) over a bottom number (diastolic). It is best to have a blood pressure that is below 120/80. What are the causes? The cause of this condition is not known. Some other conditions can lead to high blood pressure. What increases the risk? Some lifestyle factors can make you more likely to develop high blood pressure: Smoking. Not getting enough exercise or physical activity. Being overweight. Having too much fat, sugar, calories, or salt (sodium) in your diet. Drinking too much alcohol. Other risk factors include: Having any of these conditions: Heart disease. Diabetes. High cholesterol. Kidney disease. Obstructive sleep apnea. Having a family history of high blood pressure and high cholesterol. Age. The risk increases with age. Stress. What are the signs or symptoms? High blood pressure may not cause symptoms. Very high blood pressure (hypertensive crisis) may cause: Headache. Fast or uneven heartbeats (palpitations). Shortness of breath. Nosebleed. Vomiting or feeling like you may vomit (nauseous). Changes in how you see. Very bad chest pain. Feeling dizzy. Seizures. How is this treated? This condition is treated by making healthy lifestyle changes, such as: Eating healthy foods. Exercising more. Drinking less alcohol. Your doctor may prescribe medicine if lifestyle changes do not help enough and if: Your top number is above 130. Your bottom number is above 80. Your personal target  blood pressure may vary. Follow these instructions at home: Eating and drinking  If told, follow the DASH eating plan. To follow this plan: Fill one half of your plate at each meal with fruits and vegetables. Fill one fourth of your plate at each meal with whole grains. Whole grains include whole-wheat pasta, brown rice, and whole-grain bread. Eat or drink low-fat dairy products, such as skim milk or low-fat yogurt. Fill one fourth of your plate at each meal with low-fat (lean) proteins. Low-fat proteins include fish, chicken without skin, eggs, beans, and tofu. Avoid fatty meat, cured and processed meat, or chicken with skin. Avoid pre-made or processed food. Limit the amount of salt in your diet to less than 1,500 mg each day. Do not drink alcohol if: Your doctor tells you not to drink. You are pregnant, may be pregnant, or are planning to become pregnant. If you drink alcohol: Limit how much you have to: 0-1 drink a day for women. 0-2 drinks a day for men. Know how much alcohol is in your drink. In the [place] one drink equals one 12 oz bottle of beer (355 mL), one 5 oz glass of wine (148 mL), or one 1 oz glass of hard liquor (44 mL). Lifestyle  Work with your doctor to stay at a healthy weight or to lose weight. Ask your doctor what the best weight is for you. Get at least 30 minutes of exercise that causes your heart to beat faster (aerobic exercise) most days of the week. This may include walking, swimming, or biking. Get at least 30 minutes of exercise that strengthens your muscles (resistance exercise) at least 3 days a week. This may include lifting weights or doing Pilates.  Do not smoke or use any products that contain nicotine or tobacco. If you need help quitting, ask your doctor. Check your blood pressure at home as told by your doctor. Keep all follow-up visits. Medicines Take over-the-counter and prescription medicines only as told by your doctor. Follow directions  carefully. Do not skip doses of blood pressure medicine. The medicine does not work as well if you skip doses. Skipping doses also puts you at risk for problems. Ask your doctor about side effects or reactions to medicines that you should watch for. Contact a doctor if: You think you are having a reaction to the medicine you are taking. You have headaches that keep coming back. You feel dizzy. You have swelling in your ankles. You have trouble with your vision. Get help right away if: You get a very bad headache. You start to feel mixed up (confused). You feel weak or numb. You feel faint. You have very bad pain in your: Chest. Belly (abdomen). You vomit more than once. You have trouble breathing. These symptoms may be an emergency. Get help right away. Call 911. Do not wait to see if the symptoms will go away. Do not drive yourself to the hospital. Summary Hypertension is another name for high blood pressure. High blood pressure forces your heart to work harder to pump blood. For most people, a normal blood pressure is less than 120/80. Making healthy choices can help lower blood pressure. If your blood pressure does not get lower with healthy choices, you may need to take medicine. This information is not intended to replace advice given to you by your health care provider. Make sure you discuss any questions you have with your health care provider. Document Revised: 11/02/2020 Document Reviewed: 11/02/2020 Elsevier Patient Education  2023 [employer].

## 2021-09-16 ENCOUNTER — Encounter: Payer: Self-pay | Admitting: Nurse Practitioner

## 2021-09-16 DIAGNOSIS — E1165 Type 2 diabetes mellitus with hyperglycemia: Secondary | ICD-10-CM | POA: Insufficient documentation

## 2021-09-16 MED ORDER — VITAMIN D (ERGOCALCIFEROL) 1.25 MG (50000 UNIT) PO CAPS: 50000.0000 [IU] | ORAL_CAPSULE | ORAL | 1 refills | Status: DC

## 2021-09-16 MED ORDER — METFORMIN HCL 500 MG PO TABS: 500.0000 mg | ORAL_TABLET | Freq: Two times a day (BID) | ORAL | 3 refills | Status: DC

## 2021-09-16 NOTE — Assessment & Plan Note (Addendum)
Weight gain despite diet modification: increase protein and fiber intake, decrease portion size. Following [employer] soothie cleanse. Works from home. Gym:3x/week, treadmil, walking 1hr 3x/week. Lowest weight:135lbs in 2017 Heaviest weight: 220lbs 17months. Admits to stress eating due to financial strain and elderly parent's poor heath. Wt Readings from Last 3 Encounters:  09/14/21 212 lb 3.2 oz (96.3 kg)  10/05/20 206 lb (93.4 kg)  09/02/19 190 lb (86.2 kg)   Entered referral to nutritionist.

## 2021-09-16 NOTE — Assessment & Plan Note (Signed)
Low vit. D: start 50000IU weekly x 77months, then switch to 5000IU daily OTC

## 2021-09-16 NOTE — Assessment & Plan Note (Addendum)
New diagnosis Elevated glucose and hgbA1c: 7.7% indicates type 2 diabetes. Start metformin 500mg  BID and you will be contacted to schedule appt with nutritionist. Abnormal lipid panel:need heart healthy diet and daily exercise. F/up in 1months

## 2021-09-21 NOTE — Progress Notes (Signed)
This encounter was created in error - please disregard.

## 2021-10-05 ENCOUNTER — Encounter: Payer: Self-pay | Admitting: Dietician

## 2021-10-05 ENCOUNTER — Encounter: Payer: BC Managed Care – PPO | Attending: Nurse Practitioner | Admitting: Dietician

## 2021-10-05 DIAGNOSIS — E1165 Type 2 diabetes mellitus with hyperglycemia: Secondary | ICD-10-CM | POA: Diagnosis present

## 2021-10-05 NOTE — Patient Instructions (Signed)
Aim for 150 minutes of physical activity per week. -Walking is great. Consider walking 15 minutes or more following a meal.  Plan:  Aim for 2-3 Carb Choices per meal (30-45 grams) +/- 1 either way  Aim for 0-15 Carbs per snack if hungry  Include protein in moderation with your meals and snacks  Breakfast: Instead of your 2 packets of flavored oatmeal, try 1 packet of flavored oatmeal and 1 packet of original unflavored mixed. Then, add a protein like a spoon of peanut butter or a boiled egg on the side.   Reduce or avoid drinking sugar sweetened beverages such as soda and juice unless in the case of a hypoglycemic episode. Drink mostly or all water.  Tips for increasing water intake: -Keep a glass of water by your bedside so that you're encouraged to drink it upon waking -Carry a re-usable water bottle -Add fruit such as lemon, lime, berries, or cucumbers to your water -Try sparkling water -Make herbal tea and drink it hot or iced  Continue taking medication as directed by MD. Ask your doctor when your next A1C will be.  Consider asking your doctor if you need to be checking your blood sugar.   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 When tasting, try to keep it to a bite or two and avoid serving yourself a portion of the baked good as a taste.

## 2021-10-05 NOTE — Progress Notes (Signed)
Diabetes Self-Management Education  Visit Type: First/Initial  Appt. Start Time: 2992 Appt. End Time: 1032  10/05/2021  Kara Hoover, newspaper, identified by name and date of birth, is a 51 y.o. female with a diagnosis of Diabetes: Type 2.   ASSESSMENT  Patient is here today alone Patient would like to learn about nutrition and diabetes. She states she has concerns about diabetes related complications and wants to avoid them and bring her A1C down. She is highly motivated to change her diet and bring her A1C down.   History includes:  HTN, type 2 diabetes Labs noted: A1C 7.7% 09/14/2021 Medications include: metformin 500mg  morning/night Supplements: Vitamin D  Anthropometrics: Ht: 65in Wt: 213.9lbs  Patient lives with husband. She does most of the shopping and cooking but states over the last few months she has been mostly eating convenience foods and fast food.   Pt has history of yo-yo dieting. Pt mentioned the [employer] green smoothie cleanse, in which she lost 60lbs in 4 months, but then felt as though her habits changed drastically and she gained the weight back.    Pt owns a mobile baking business. She states this can be difficult because she needs to try the foods but will usually serve herself a full portion instead of a taste.   Height 5\' 5"  (1.651 m), weight 213 lb 14.4 oz (97 kg). Body mass index is 35.59 kg/m.   Diabetes Self-Management Education - 10/05/21 0932       Visit Information   Visit Type First/Initial      Initial Visit   Diabetes Type Type 2    Date Diagnosed 09/11/21    Are you currently following a meal plan? No    Are you taking your medications as prescribed? Yes      Health Coping   How would you rate your overall health? Good      Psychosocial Assessment   Patient Belief/Attitude about Diabetes Motivated to manage diabetes    What is the hardest part about your diabetes right now, causing you the most concern, or is the most worrisome to you  about your diabetes?   Making healty food and beverage choices    Self-care barriers None    Self-management support Doctor's office;Family    Other persons present Patient    Patient Concerns Nutrition/Meal planning;Healthy Lifestyle    Special Needs None    Preferred Learning Style No preference indicated    Learning Readiness Ready    How often do you need to have someone help you when you read instructions, pamphlets, or other written materials from your doctor or pharmacy? 1 - Never    What is the last grade level you completed in school? 12th      Pre-Education Assessment   Patient understands the diabetes disease and treatment process. Needs Instruction    Patient understands incorporating nutritional management into lifestyle. Needs Instruction    Patient undertands incorporating physical activity into lifestyle. Needs Instruction    Patient understands using medications safely. Needs Instruction    Patient understands monitoring blood glucose, interpreting and using results Needs Instruction    Patient understands prevention, detection, and treatment of acute complications. Needs Instruction    Patient understands prevention, detection, and treatment of chronic complications. Needs Instruction    Patient understands how to develop strategies to address psychosocial issues. Needs Instruction    Patient understands how to develop strategies to promote health/change behavior. Needs Instruction      Complications  Last HgB A1C per patient/outside source 7.7 %   09/11/21   How often do you check your blood sugar? Not recommended by provider    Have you had a dilated eye exam in the past 12 months? Yes    Have you had a dental exam in the past 12 months? Yes    Are you checking your feet? Yes    How many days per week are you checking your feet? 7      Dietary Intake   Breakfast skips OR apple cinnamon oatmeal 2 packets    Snack (morning) none    Lunch leftovers (hot dog, fast  food)    Snack (afternoon) chips    Dinner sweet potato and spinach OR collard greens and black eyed peas OR popeyes    Snack (evening) ice cream OR chips OR cake    Beverage(s) juice, soda, some water (32oz)      Activity / Exercise   Activity / Exercise Type Light (walking / raking leaves)    How many days per week do you exercise? 3    How many minutes per day do you exercise? 60    Total minutes per week of exercise 180      Patient Education   Previous Diabetes Education No    Disease Pathophysiology Definition of diabetes, type 1 and 2, and the diagnosis of diabetes;Factors that contribute to the development of diabetes;Explored patient's options for treatment of their diabetes    Healthy Eating Role of diet in the treatment of diabetes and the relationship between the three main macronutrients and blood glucose level;Plate Method;Carbohydrate counting;Reviewed blood glucose goals for pre and post meals and how to evaluate the patients' food intake on their blood glucose level.;Meal timing in regards to the patients' current diabetes medication.;Information on hints to eating out and maintain blood glucose control.;Meal options for control of blood glucose level and chronic complications.    Being Active Role of exercise on diabetes management, blood pressure control and cardiac health.;Helped patient identify appropriate exercises in relation to his/her diabetes, diabetes complications and other health issue.    Medications Reviewed patients medication for diabetes, action, purpose, timing of dose and side effects.;Reviewed medication adjustment guidelines for hyperglycemia and sick days.    Monitoring Identified appropriate SMBG and/or A1C goals.;Daily foot exams;Yearly dilated eye exam    Acute complications Taught prevention, symptoms, and  treatment of hypoglycemia - the 15 rule.;Discussed and identified patients' prevention, symptoms, and treatment of hyperglycemia.    Chronic  complications Relationship between chronic complications and blood glucose control;Assessed and discussed foot care and prevention of foot problems;Lipid levels, blood glucose control and heart disease;Dental care;Retinopathy and reason for yearly dilated eye exams;Nephropathy, what it is, prevention of, the use of ACE, ARB's and early detection of through urine microalbumia.;Reviewed with patient heart disease, higher risk of, and prevention    Diabetes Stress and Support Identified and addressed patients feelings and concerns about diabetes;Worked with patient to identify barriers to care and solutions;Role of stress on diabetes;Helped patient identify a support system for diabetes management    Lifestyle and Health Coping Lifestyle issues that need to be addressed for better diabetes care      Individualized Goals (developed by patient)   Nutrition Follow meal plan discussed;General guidelines for healthy choices and portions discussed    Physical Activity Exercise 5-7 days per week;30 minutes per day    Medications take my medication as prescribed    Monitoring  Not Applicable  Problem Solving Eating Pattern    Reducing Risk Not Applicable    Health Coping Ask for help with psychological, social, or emotional issues      Post-Education Assessment   Patient understands the diabetes disease and treatment process. Comprehends key points    Patient understands incorporating nutritional management into lifestyle. Comprehends key points    Patient undertands incorporating physical activity into lifestyle. Demonstrates understanding / competency    Patient understands using medications safely. Demonstrates understanding / competency    Patient understands monitoring blood glucose, interpreting and using results Comprehends key points    Patient understands prevention, detection, and treatment of acute complications. Comprehends key points    Patient understands prevention, detection, and treatment  of chronic complications. Comprehends key points    Patient understands how to develop strategies to address psychosocial issues. Demonstrates understanding / competency    Patient understands how to develop strategies to promote health/change behavior. Comprehends key points      Outcomes   Expected Outcomes Demonstrated interest in learning. Expect positive outcomes    Future DMSE 3-4 months    Program Status Not Completed             Individualized Plan for Diabetes Self-Management Training:   Learning Objective:  Patient will have a greater understanding of diabetes self-management. Patient education plan is to attend individual and/or group sessions per assessed needs and concerns.   Plan:   Patient Instructions  Aim for 150 minutes of physical activity per week. -Walking is great. Consider walking 15 minutes or more following a meal.  Plan:  Aim for 2-3 Carb Choices per meal (30-45 grams) +/- 1 either way  Aim for 0-15 Carbs per snack if hungry  Include protein in moderation with your meals and snacks  Breakfast: Instead of your 2 packets of flavored oatmeal, try 1 packet of flavored oatmeal and 1 packet of original unflavored mixed. Then, add a protein like a spoon of peanut butter or a boiled egg on the side.   Reduce or avoid drinking sugar sweetened beverages such as soda and juice unless in the case of a hypoglycemic episode. Drink mostly or all water.  Tips for increasing water intake: -Keep a glass of water by your bedside so that you're encouraged to drink it upon waking -Carry a re-usable water bottle -Add fruit such as lemon, lime, berries, or cucumbers to your water -Try sparkling water -Make herbal tea and drink it hot or iced  Continue taking medication as directed by MD. Ask your doctor when your next A1C will be.  Consider asking your doctor if you need to be checking your blood sugar.   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 When tasting, try to keep it to a bite or two and avoid serving yourself a portion of the baked good as a taste.  Expected Outcomes:  Demonstrated interest in learning. Expect positive outcomes  Education material provided: ADA - How to Thrive: A Guide for Your Journey with Diabetes, A1C conversion sheet, Meal plan card, My Plate, and Snack sheet, Dining Out the [facility].  If problems or questions, patient to contact team via:  Phone  Future DSME appointment: 3-4 months

## 2021-10-12 ENCOUNTER — Encounter: Payer: Self-pay | Admitting: Nurse Practitioner

## 2021-10-12 ENCOUNTER — Other Ambulatory Visit: Payer: Self-pay

## 2021-10-12 ENCOUNTER — Ambulatory Visit (INDEPENDENT_AMBULATORY_CARE_PROVIDER_SITE_OTHER): Payer: BC Managed Care – PPO | Admitting: Nurse Practitioner

## 2021-10-12 ENCOUNTER — Telehealth: Payer: Self-pay | Admitting: Nurse Practitioner

## 2021-10-12 VITALS — BP 128/82 | Temp 96.1°F | Ht 65.0 in | Wt 213.6 lb

## 2021-10-12 DIAGNOSIS — I10 Essential (primary) hypertension: Secondary | ICD-10-CM | POA: Diagnosis not present

## 2021-10-12 DIAGNOSIS — E1165 Type 2 diabetes mellitus with hyperglycemia: Secondary | ICD-10-CM

## 2021-10-12 LAB — MICROALBUMIN / CREATININE URINE RATIO
Creatinine,U: 237.4 mg/dL
Microalb Creat Ratio: 1.8 mg/g (ref 0.0–30.0)
Microalb, Ur: 4.3 mg/dL — ABNORMAL HIGH (ref 0.0–1.9)

## 2021-10-12 MED ORDER — TIRZEPATIDE 2.5 MG/0.5ML ~~LOC~~ SOAJ: 2.5000 mg | SUBCUTANEOUS | 0 refills | Status: DC

## 2021-10-12 NOTE — Patient Instructions (Signed)
Go to lab Maintain other med doses Start mounjaro injection.

## 2021-10-12 NOTE — Telephone Encounter (Signed)
Pt would like for her prescription to be sent to the [facility] on [address]. She said she can drive to [place] if that is the only place that has her med.

## 2021-10-12 NOTE — Progress Notes (Signed)
Established Patient Visit  Patient: Kara Hoover   DOB: Nov 11, 1970   51 y.o. Female  MRN: [id number] Visit Date: 10/12/2021  Subjective:    Chief Complaint  Patient presents with   Office Visit    HTN/ Weight management  Checks BP weekly  Would like to start ozempic  Requesting records for eye exam  Unable to obtain O2 or Pulse    HPI Primary hypertension BP at goal with valsartan  denies any adverse side effects. She has made diet modifications: low sodium Start walking 3x/week, 573mns each.  BP Readings from Last 3 Encounters:  10/12/21 128/82  09/14/21 (!) 156/102  10/06/20 118/90   Maintain med dose F/up in 243month Type 2 diabetes mellitus with hyperglycemia, without long-term current use of insulin ([place] with nutritionist and has made dietary modifications No glucose check at home at this time. Advised about need for proper foot wear and skin care. Complete foot exam today. No adverse effects with metformin Continues to schedule with food cravings.  Add mounjaro 2.73m53meekly, advised about possible side effects. Maintain metformin dose F/up in 8mo79monthalking 30mi7mx/week Diet: decreased portion size,  BP Readings from Last 3 Encounters:  10/12/21 128/82  09/14/21 (!) 156/102  10/06/20 118/90    Wt Readings from Last 3 Encounters:  10/12/21 213 lb 9.6 oz (96.9 kg)  10/05/21 213 lb 14.4 oz (97 kg)  09/14/21 212 lb 3.2 oz (96.3 kg)   Reviewed medical, surgical, and social history today  Medications: Outpatient Medications Prior to Visit  Medication Sig   metFORMIN (GLUCOPHAGE) 500 MG tablet Take 1 tablet (500 mg total) by mouth 2 (two) times daily with a meal.   valsartan (DIOVAN) 80 MG tablet Take 1 tablet (80 mg total) by mouth daily.   Vitamin D, Ergocalciferol, (DRISDOL) 1.25 MG (50000 UNIT) CAPS capsule Take 1 capsule (50,000 Units total) by mouth every 7 (seven) days.   No facility-administered medications prior to  visit.   Reviewed past medical and social history.   ROS per HPI above      Objective:  BP 128/82 (BP Location: Right Arm, Patient Position: Sitting, Cuff Size: Normal)   Temp (!) 96.1 F (35.6 C) (Temporal)   Ht _0  (1.651 m)   Wt 213 lb 9.6 oz (96.9 kg)   BMI 35.54 kg/m      Physical Exam Constitutional:      Appearance: She is obese.  Cardiovascular:     Rate and Rhythm: Normal rate and regular rhythm.     Pulses: Normal pulses.          Dorsalis pedis pulses are 2+ on the right side and 2+ on the left side.     Heart sounds: Normal heart sounds.  Pulmonary:     Effort: Pulmonary effort is normal.     Breath sounds: Normal breath sounds.  Musculoskeletal:     Right lower leg: Edema present.     Left lower leg: Edema present.     Right foot: Normal range of motion. No deformity, bunion, Charcot foot, foot drop or prominent metatarsal heads.     Left foot: Normal range of motion. No deformity, bunion, Charcot foot, foot drop or prominent metatarsal heads.  Feet:     Right foot:     Protective Sensation: 8 sites tested.  8 sites sensed.     Skin integrity: Skin integrity normal.  Toenail Condition: Right toenails are long.     Left foot:     Protective Sensation: 8 sites tested.  8 sites sensed.     Skin integrity: Skin integrity normal.     Toenail Condition: Left toenails are long.  Neurological:     Mental Status: She is alert.     No results found for any visits on 10/12/21.    Assessment & Plan:    Problem List Items Addressed This Visit       Cardiovascular and Mediastinum   Primary hypertension    BP at goal with valsartan  denies any adverse side effects. She has made diet modifications: low sodium Start walking 3x/week, 776mns each.  BP Readings from Last 3 Encounters:  10/12/21 128/82  09/14/21 (!) 156/102  10/06/20 118/90   Maintain med dose F/up in 281month       Endocrine   Type 2 diabetes mellitus with hyperglycemia, without  long-term current use of insulin ([place]- Primary    Met with nutritionist and has made dietary modifications No glucose check at home at this time. Advised about need for proper foot wear and skin care. Complete foot exam today. No adverse effects with metformin Continues to schedule with food cravings.  Add mounjaro 2.76m78meekly, advised about possible side effects. Maintain metformin dose F/up in 25mo71month   Relevant Medications   tirzepatide ([facility] MG/0.5ML Pen   Other Relevant Orders   Urine microalbumin-creatinine with uACR   Return in about 8 weeks (around 12/07/2021) for DM, HTN, hyperlipidemia (fasting).     CharWilfred Lacy

## 2021-10-12 NOTE — Assessment & Plan Note (Signed)
Met with nutritionist and has made dietary modifications No glucose check at home at this time. Advised about need for proper foot wear and skin care. Complete foot exam today. No adverse effects with metformin Continues to schedule with food cravings.  Add mounjaro 2.56m weekly, advised about possible side effects. Maintain metformin dose F/up in 279month

## 2021-10-12 NOTE — Assessment & Plan Note (Signed)
BP at goal with valsartan  denies any adverse side effects. She has made diet modifications: low sodium Start walking 3x/week, each.  BP Readings from Last 3 Encounters:  10/12/21 128/82  09/14/21 (!) 156/102  10/06/20 118/90   Maintain med dose F/up in 58months

## 2021-10-15 ENCOUNTER — Encounter: Payer: Self-pay | Admitting: Nurse Practitioner

## 2021-10-18 ENCOUNTER — Other Ambulatory Visit: Payer: Self-pay | Admitting: Nurse Practitioner

## 2021-10-18 ENCOUNTER — Telehealth: Payer: Self-pay

## 2021-10-18 DIAGNOSIS — Z1231 Encounter for screening mammogram for malignant neoplasm of breast: Secondary | ICD-10-CM

## 2021-10-18 LAB — HM DIABETES EYE EXAM

## 2021-10-18 NOTE — Telephone Encounter (Signed)
Calling pt to help get scheduled for mammo. LVM for pt to call the office.

## 2021-10-29 ENCOUNTER — Telehealth: Payer: Self-pay | Admitting: Nurse Practitioner

## 2021-10-29 NOTE — Telephone Encounter (Signed)
Mary from [employer] is calling concerning  a PA for [employer]. The call back [phone], reference for this PA is [id number]

## 2021-10-29 NOTE — Telephone Encounter (Signed)
Called & spoke w/ Stanton Kidney. She wanted to know if pt was still using ozempic, adv her therapy changed and is now using Mounjaro. Stanton Kidney has canceled out the ozempic PA.

## 2021-11-12 MED ORDER — PHENTERMINE HCL 15 MG PO CAPS: 15.0000 mg | ORAL_CAPSULE | ORAL | 0 refills | Status: DC

## 2021-11-12 NOTE — Addendum Note (Signed)
Addended by: Wilfred Lacy L on: 11/12/2021 03:49 PM   Modules accepted: Orders

## 2021-11-13 ENCOUNTER — Other Ambulatory Visit: Payer: Self-pay

## 2021-11-13 MED ORDER — PHENTERMINE HCL 15 MG PO CAPS: 15.0000 mg | ORAL_CAPSULE | ORAL | 0 refills | Status: DC

## 2021-11-13 NOTE — Addendum Note (Signed)
Addended by: Wilfred Lacy L on: 11/13/2021 04:08 PM   Modules accepted: Orders

## 2021-11-16 ENCOUNTER — Ambulatory Visit: Payer: BC Managed Care – PPO

## 2021-12-07 ENCOUNTER — Ambulatory Visit (INDEPENDENT_AMBULATORY_CARE_PROVIDER_SITE_OTHER): Payer: BC Managed Care – PPO | Admitting: Nurse Practitioner

## 2021-12-07 ENCOUNTER — Encounter: Payer: Self-pay | Admitting: Nurse Practitioner

## 2021-12-07 ENCOUNTER — Other Ambulatory Visit (HOSPITAL_COMMUNITY): Payer: Self-pay

## 2021-12-07 VITALS — BP 118/72 | HR 61 | Temp 97.7°F | Ht 65.0 in | Wt 206.8 lb

## 2021-12-07 DIAGNOSIS — E1165 Type 2 diabetes mellitus with hyperglycemia: Secondary | ICD-10-CM

## 2021-12-07 DIAGNOSIS — Z6835 Body mass index (BMI) 35.0-35.9, adult: Secondary | ICD-10-CM | POA: Diagnosis not present

## 2021-12-07 DIAGNOSIS — I10 Essential (primary) hypertension: Secondary | ICD-10-CM

## 2021-12-07 MED ORDER — OLMESARTAN MEDOXOMIL 20 MG PO TABS: 20.0000 mg | ORAL_TABLET | Freq: Every day | ORAL | 3 refills | Status: DC

## 2021-12-07 MED ORDER — PHENTERMINE HCL 30 MG PO CAPS: 30.0000 mg | ORAL_CAPSULE | ORAL | 1 refills | Status: DC

## 2021-12-07 MED ORDER — BLOOD GLUCOSE MONITOR KIT: PACK | 0 refills | Status: AC

## 2021-12-07 NOTE — Patient Instructions (Addendum)
Increased phentermine to 30mg  in AM Change valsartan to olmesartan due to cost. Continue heart healthy diet and daily exercise. Monitor BP 3x/week in AM Monitor glucose once day in Am as needed

## 2021-12-07 NOTE — Assessment & Plan Note (Signed)
No adverse effects with phentermine 15mg  Unable to afford ozempic or mounjaro BP at Goal Has made lifestyle modifications: low carb/low fat/low sugar/low sodium diet and daily exercise Lost 8lbs in last 49month Wt Readings from Last 3 Encounters:  12/07/21 206 lb 12.8 oz (93.8 kg)  10/12/21 213 lb 9.6 oz (96.9 kg)  10/05/21 213 lb 14.4 oz (97 kg)    Increase phentermine 30mg  Advised to monitor BP at home 3x/week Continue heart healthy diet, portion control and exercise F/up in 53month

## 2021-12-07 NOTE — Progress Notes (Signed)
Established Patient Visit  Patient: Kara Hoover   DOB: 1970/03/14   51 y.o. Female  MRN: [id number] Visit Date: 12/07/2021  Subjective:    Chief Complaint  Patient presents with   Office Visit    HTN/ DM/ Hyperlipidemia  Pt fatsing Doesn't check BP of BS  No concerns     HPI Primary hypertension BP at goal BP Readings from Last 3 Encounters:  12/07/21 118/72  10/12/21 128/82  09/14/21 (!) 156/102    Switched valsartan to olmesartan due to cost Patient notified New rx sent  Obesity No adverse effects with phentermine 87m Unable to afford ozempic or mounjaro BP at Goal Has made lifestyle modifications: low carb/low fat/low sugar/low sodium diet and daily exercise Lost 8lbs in last 138montht Readings from Last 3 Encounters:  12/07/21 206 lb 12.8 oz (93.8 kg)  10/12/21 213 lb 9.6 oz (96.9 kg)  10/05/21 213 lb 14.4 oz (97 kg)    Increase phentermine 3078mdvised to monitor BP at home 3x/week Continue heart healthy diet, portion control and exercise F/up in 62mo28monthReviewed medical, surgical, and social history today  Medications: Outpatient Medications Prior to Visit  Medication Sig   metFORMIN (GLUCOPHAGE) 500 MG tablet Take 1 tablet (500 mg total) by mouth 2 (two) times daily with a meal.   Vitamin D, Ergocalciferol, (DRISDOL) 1.25 MG (50000 UNIT) CAPS capsule Take 1 capsule (50,000 Units total) by mouth every 7 (seven) days.   [DISCONTINUED] phentermine 15 MG capsule Take 1 capsule (15 mg total) by mouth every morning.   [DISCONTINUED] valsartan (DIOVAN) 80 MG tablet Take 1 tablet (80 mg total) by mouth daily.   [DISCONTINUED] tirzepatide ([facility]5 MG/0.5ML Pen Inject 2.5 mg into the skin once a week. (Patient not taking: Reported on 12/07/2021)   No facility-administered medications prior to visit.   Reviewed past medical and social history.   ROS per HPI above      Objective:  BP 118/72   Pulse 61   Temp 97.7 F (36.5  C) (Temporal)   Ht _0  (1.651 m)   Wt 206 lb 12.8 oz (93.8 kg)   SpO2 96%   BMI 34.41 kg/m      Physical Exam Constitutional:      Appearance: She is obese.  Cardiovascular:     Rate and Rhythm: Normal rate and regular rhythm.     Pulses: Normal pulses.     Heart sounds: Normal heart sounds.  Pulmonary:     Effort: Pulmonary effort is normal.     Breath sounds: Normal breath sounds.  Musculoskeletal:     Right lower leg: No edema.     Left lower leg: No edema.  Neurological:     Mental Status: She is alert and oriented to person, place, and time.  Psychiatric:        Mood and Affect: Mood normal.        Behavior: Behavior normal.        Thought Content: Thought content normal.     No results found for any visits on 12/07/21.    Assessment & Plan:    Problem List Items Addressed This Visit       Cardiovascular and Mediastinum   Primary hypertension    BP at goal BP Readings from Last 3 Encounters:  12/07/21 118/72  10/12/21 128/82  09/14/21 (!) 156/102    Switched valsartan to  olmesartan due to cost Patient notified New rx sent      Relevant Medications   olmesartan (BENICAR) 20 MG tablet     Endocrine   Type 2 diabetes mellitus with hyperglycemia, without long-term current use of insulin (HCC) - Primary   Relevant Medications   olmesartan (BENICAR) 20 MG tablet   blood glucose meter kit and supplies KIT     Other   Obesity    No adverse effects with phentermine 56m Unable to afford ozempic or mounjaro BP at Goal Has made lifestyle modifications: low carb/low fat/low sugar/low sodium diet and daily exercise Lost 8lbs in last 147montht Readings from Last 3 Encounters:  12/07/21 206 lb 12.8 oz (93.8 kg)  10/12/21 213 lb 9.6 oz (96.9 kg)  10/05/21 213 lb 14.4 oz (97 kg)    Increase phentermine 3040mdvised to monitor BP at home 3x/week Continue heart healthy diet, portion control and exercise F/up in 30mo56month  Relevant Medications    phentermine 30 MG capsule   Return in about 2 months (around 02/06/2022) for HTN, DM, hyperlipidemia (fasting).     Kara Hoover

## 2021-12-07 NOTE — Assessment & Plan Note (Signed)
BP at goal BP Readings from Last 3 Encounters:  12/07/21 118/72  10/12/21 128/82  09/14/21 (!) 156/102    Switched valsartan to olmesartan due to cost Patient notified New rx sent

## 2021-12-11 ENCOUNTER — Encounter: Payer: Self-pay | Admitting: Nurse Practitioner

## 2021-12-11 DIAGNOSIS — E1165 Type 2 diabetes mellitus with hyperglycemia: Secondary | ICD-10-CM

## 2021-12-11 DIAGNOSIS — I10 Essential (primary) hypertension: Secondary | ICD-10-CM

## 2021-12-12 MED ORDER — VALSARTAN 80 MG PO TABS: 80.0000 mg | ORAL_TABLET | Freq: Every day | ORAL | 3 refills | Status: DC

## 2021-12-12 NOTE — Telephone Encounter (Signed)
Caller Name: Donnie Coffin w/[facility] Call back phone #: [phone]  Reason for Call: clarifying that olmesartan is being discontinued. I confirmed that is correct. Donnie Coffin states that pt had $300 copay with olmesartan and has $150 copay with valsartan. Please call to advise if he should fill valsartan RX for pt.

## 2021-12-13 MED ORDER — ACCU-CHEK GUIDE VI STRP: 1.0000 | ORAL_STRIP | Freq: Every day | 3 refills | Status: AC

## 2021-12-13 NOTE — Addendum Note (Signed)
Addended by: Alysia Penna L on: 12/13/2021 03:14 PM   Modules accepted: Orders

## 2021-12-28 ENCOUNTER — Encounter: Payer: BC Managed Care – PPO | Attending: Nurse Practitioner | Admitting: Dietician

## 2021-12-28 ENCOUNTER — Ambulatory Visit
Admission: RE | Admit: 2021-12-28 | Discharge: 2021-12-28 | Disposition: A | Discharge status: Home / Self Care | Payer: BC Managed Care – PPO | Source: Ambulatory Visit | Attending: Nurse Practitioner | Admitting: Nurse Practitioner

## 2021-12-28 VITALS — Ht 65.0 in | Wt 201.0 lb

## 2021-12-28 DIAGNOSIS — Z1231 Encounter for screening mammogram for malignant neoplasm of breast: Secondary | ICD-10-CM

## 2021-12-28 DIAGNOSIS — E119 Type 2 diabetes mellitus without complications: Secondary | ICD-10-CM | POA: Insufficient documentation

## 2021-12-28 NOTE — Progress Notes (Signed)
**Note De-Kara via Obfuscation** Diabetes Self-Management Education  Visit Type: Follow-up  Appt. Start Time: 1135 Appt. End Time: 1210  12/28/2021  Kara Hoover, Kara Hoover, Kara Hoover, is a 51 y.o. female with a diagnosis of Diabetes:  .   ASSESSMENT  Primary concern: Patient would like to learn about nutrition and diabetes. She states she has concerns about diabetes related complications and wants to avoid them and bring her A1C down. She is highly motivated to change her diet and bring her A1C down.    History includes:  HTN, type 2 diabetes Labs noted: A1C 7.7% 09/14/2021 Medications include: metformin 500mg  morning/night Supplements: Vitamin D   Anthropometrics: Ht: 65in Wt: 201lbs 10/05/21: 213.9lbs   Pt has been jump roping every morning while she takes her dog out. Pt goes to the gym and walks on the treadmill and stairstepper for an hour 3x/wk.   Pt states she has not been checking her blood glucose often because the strips are expensive.   Pt states she was mindful at thanksgiving and feels that her appetite has decreased and she is more mindful about food choices and trying her baked goods.   Pt states her doctor said she couldn't have strawberries but she is wanting options for variety in her diet.    Pt cut out all sweet drinks and just drinks water now. Pt states she feels much better energy levels.  Pt has lost around 13 lbs over 3 months since last visit, averaging 1lb/wk.   Weight 201 lb (91.2 kg). Body mass index is 33.45 kg/m.   Diabetes Self-Management Education - 12/28/21 1138       Visit Information   Visit Type Follow-up      Health Coping   How would you rate your overall health? Good      Psychosocial Assessment   Patient Belief/Attitude about Diabetes Motivated to manage diabetes    What is the hardest part about your diabetes right now, causing you the most concern, or is the most worrisome to you about your diabetes?   Making healty food and  beverage choices    Self-care barriers None    Self-management support Doctor's office    Other persons present Patient    Patient Concerns Nutrition/Meal planning    Special Needs None    Learning Readiness Ready      Pre-Education Assessment   Patient understands the diabetes disease and treatment process. Comprehends key points    Patient understands incorporating nutritional management into lifestyle. Comprehends key points    Patient undertands incorporating physical activity into lifestyle. Comprehends key points    Patient understands using medications safely. Comprehends key points    Patient understands monitoring blood glucose, interpreting and using results Comprehends key points    Patient understands prevention, detection, and treatment of acute complications. Comprehends key points    Patient understands prevention, detection, and treatment of chronic complications. Compreheands key points    Patient understands how to develop strategies to address psychosocial issues. Comprehends key points    Patient understands how to develop strategies to promote health/change behavior. Comprehends key points      Complications   Last HgB A1C per patient/outside source 7.7 %    How often do you check your blood sugar? 3-4 times/day      Dietary Intake   Breakfast plain oatmeal and boiled egg    Snack (morning) none    Lunch grilled chicken and asparagus    Snack (afternoon) none  Dinner chicken and aspargus OR homemade soup with chicken and vegetables    Beverage(s) water      Activity / Exercise   Activity / Exercise Type Light (walking / raking leaves)    How many days per week do you exercise? 3    How many minutes per day do you exercise? 60    Total minutes per week of exercise 180      Patient Education   Previous Diabetes Education Yes (please comment)    Disease Pathophysiology Explored patient's options for treatment of their diabetes    Healthy Eating Plate  Method;Carbohydrate counting;Reviewed blood glucose goals for pre and post meals and how to evaluate the patients' food intake on their blood glucose level.;Role of diet in the treatment of diabetes and the relationship between the three main macronutrients and blood glucose level;Meal timing in regards to the patients' current diabetes medication.;Information on hints to eating out and maintain blood glucose control.;Meal options for control of blood glucose level and chronic complications.    Being Active Role of exercise on diabetes management, blood pressure control and cardiac health.;Helped patient identify appropriate exercises in relation to his/her diabetes, diabetes complications and other health issue.;Kara with patient nutritional and/or medication changes necessary with exercise.    Medications Reviewed patients medication for diabetes, action, purpose, timing of dose and side effects.    Monitoring Kara appropriate SMBG and/or A1C goals.;Daily foot exams;Yearly dilated eye exam    Chronic complications Relationship between chronic complications and blood glucose control    Diabetes Stress and Support Kara and addressed patients feelings and concerns about diabetes;Worked with patient to identify barriers to care and solutions;Role of stress on diabetes    Lifestyle and Health Coping Lifestyle issues that need to be addressed for better diabetes care      Individualized Goals (developed by patient)   Nutrition General guidelines for healthy choices and portions discussed    Physical Activity Exercise 3-5 times per week;30 minutes per day    Medications take my medication as prescribed    Monitoring  Test my blood glucose as discussed    Problem Solving Eating Pattern    Reducing Risk examine blood glucose patterns;do foot checks daily;treat hypoglycemia with 15 grams of carbs if blood glucose less than 70mg /dL    Health Coping Ask for help with psychological, social, or  emotional issues      Patient Self-Evaluation of Goals - Patient rates self as meeting previously set goals (% of time)   Nutrition >75% (most of the time)    Physical Activity 50 - 75 % (half of the time)    Medications >75% (most of the time)    Monitoring 25 - 50% (sometimes)    Problem Solving and behavior change strategies  50 - 75 % (half of the time)    Reducing Risk (treating acute and chronic complications) 50 - 75 % (half of the time)    Health Coping 50 - 75 % (half of the time)      Post-Education Assessment   Patient understands the diabetes disease and treatment process. Comprehends key points    Patient understands incorporating nutritional management into lifestyle. Demonstrates understanding / competency    Patient undertands incorporating physical activity into lifestyle. Demonstrates understanding / competency    Patient understands using medications safely. Demonstrates understanding / competency    Patient understands monitoring blood glucose, interpreting and using results Comprehends key points    Patient understands prevention, detection, and treatment  of acute complications. Comprehends key points    Patient understands prevention, detection, and treatment of chronic complications. Comprehends key points    Patient understands how to develop strategies to address psychosocial issues. Comprehends key points    Patient understands how to develop strategies to promote health/change behavior. Comprehends key points      Outcomes   Expected Outcomes Demonstrated interest in learning. Expect positive outcomes    Future DMSE 3-4 months    Program Status Not Completed      Subsequent Visit   Since your last visit have you continued or begun to take your medications as prescribed? Yes    Since your last visit have you experienced any weight changes? Loss   13 lbs   Weight Loss (lbs) 13    Since your last visit, are you checking your blood glucose at least once a day?  Yes             Individualized Plan for Diabetes Self-Management Training:   Learning Objective:  Patient will have a greater understanding of diabetes self-management. Patient education plan is to attend individual and/or group sessions per assessed needs and concerns.   Plan:   Patient Instructions  Goal: continue going to the gym 3x/wk or more.   Smoothie -1 serving fruit -protein (greek yogurt, peanut butter, protein powder) -vegetable (spinach or kale) -water or unsweetened almond milk  When snacking aim to include both a carb and protein.  Sparkling water recommendation: Spindrift  Expected Outcomes:  Demonstrated interest in learning. Expect positive outcomes  Education material provided: My Plate and Snack sheet, Balanced Plate Food Groups   If problems or questions, patient to contact team via:  Phone  Future DSME appointment: 3-4 months

## 2021-12-28 NOTE — Patient Instructions (Addendum)
Goal: continue going to the gym 3x/wk or more.   Smoothie -1 serving fruit -protein (greek yogurt, peanut butter, protein powder) -vegetable (spinach or kale) -water or unsweetened almond milk  When snacking aim to include both a carb and protein.  Sparkling water recommendation: Spindrift

## 2022-01-29 ENCOUNTER — Encounter: Payer: Self-pay | Admitting: Nurse Practitioner

## 2022-01-29 ENCOUNTER — Other Ambulatory Visit: Payer: Self-pay | Admitting: Nurse Practitioner

## 2022-01-29 DIAGNOSIS — E559 Vitamin D deficiency, unspecified: Secondary | ICD-10-CM

## 2022-01-29 MED ORDER — PHENTERMINE HCL 30 MG PO CAPS: 30.0000 mg | ORAL_CAPSULE | ORAL | 0 refills | Status: DC

## 2022-02-01 ENCOUNTER — Other Ambulatory Visit: Payer: Self-pay

## 2022-02-01 DIAGNOSIS — I10 Essential (primary) hypertension: Secondary | ICD-10-CM

## 2022-02-01 DIAGNOSIS — E1165 Type 2 diabetes mellitus with hyperglycemia: Secondary | ICD-10-CM

## 2022-02-01 MED ORDER — METFORMIN HCL 500 MG PO TABS: 500.0000 mg | ORAL_TABLET | Freq: Two times a day (BID) | ORAL | 3 refills | Status: DC

## 2022-02-01 MED ORDER — VALSARTAN 80 MG PO TABS: 80.0000 mg | ORAL_TABLET | Freq: Every day | ORAL | 3 refills | Status: DC

## 2022-02-08 ENCOUNTER — Other Ambulatory Visit (INDEPENDENT_AMBULATORY_CARE_PROVIDER_SITE_OTHER): Payer: BC Managed Care – PPO

## 2022-02-08 ENCOUNTER — Telehealth: Payer: Self-pay | Admitting: Nurse Practitioner

## 2022-02-08 ENCOUNTER — Ambulatory Visit (INDEPENDENT_AMBULATORY_CARE_PROVIDER_SITE_OTHER): Payer: BC Managed Care – PPO | Admitting: Nurse Practitioner

## 2022-02-08 ENCOUNTER — Encounter: Payer: Self-pay | Admitting: Nurse Practitioner

## 2022-02-08 VITALS — BP 118/80 | HR 83 | Temp 97.0°F | Ht 65.0 in | Wt 198.6 lb

## 2022-02-08 DIAGNOSIS — E1165 Type 2 diabetes mellitus with hyperglycemia: Secondary | ICD-10-CM

## 2022-02-08 DIAGNOSIS — E559 Vitamin D deficiency, unspecified: Secondary | ICD-10-CM | POA: Diagnosis not present

## 2022-02-08 DIAGNOSIS — Z6835 Body mass index (BMI) 35.0-35.9, adult: Secondary | ICD-10-CM

## 2022-02-08 DIAGNOSIS — I10 Essential (primary) hypertension: Secondary | ICD-10-CM

## 2022-02-08 DIAGNOSIS — E785 Hyperlipidemia, unspecified: Secondary | ICD-10-CM

## 2022-02-08 DIAGNOSIS — E1169 Type 2 diabetes mellitus with other specified complication: Secondary | ICD-10-CM

## 2022-02-08 DIAGNOSIS — Z1211 Encounter for screening for malignant neoplasm of colon: Secondary | ICD-10-CM

## 2022-02-08 LAB — LDL CHOLESTEROL, DIRECT: Direct LDL: 123 mg/dL

## 2022-02-08 LAB — RENAL FUNCTION PANEL
Albumin: 4.3 g/dL (ref 3.5–5.2)
BUN: 12 mg/dL (ref 6–23)
CO2: 25 mEq/L (ref 19–32)
Calcium: 9.8 mg/dL (ref 8.4–10.5)
Chloride: 106 mEq/L (ref 96–112)
Creatinine, Ser: 1.04 mg/dL (ref 0.40–1.20)
GFR: 61.49 mL/min (ref >60.00)
Glucose, Bld: 119 mg/dL — ABNORMAL HIGH (ref 70–99)
Phosphorus: 3.3 mg/dL (ref 2.3–4.6)
Potassium: 5 mEq/L (ref 3.5–5.1)
Sodium: 141 mEq/L (ref 135–145)

## 2022-02-08 LAB — VITAMIN D 25 HYDROXY (VIT D DEFICIENCY, FRACTURES): VITD: 34.85 ng/mL (ref 30.00–100.00)

## 2022-02-08 LAB — HEMOGLOBIN A1C: Hgb A1c MFr Bld: 6.7 % — ABNORMAL HIGH (ref 4.6–6.5)

## 2022-02-08 MED ORDER — TIRZEPATIDE 2.5 MG/0.5ML ~~LOC~~ SOAJ: 2.5000 mg | SUBCUTANEOUS | 1 refills | Status: DC

## 2022-02-08 NOTE — Telephone Encounter (Signed)
Pt called and stated that the pharmacy is waiting for prior authorization monjero

## 2022-02-08 NOTE — Assessment & Plan Note (Addendum)
Has completed high dose vit D x 12weeks Repeat vit. D

## 2022-02-08 NOTE — Assessment & Plan Note (Signed)
No adverse effects with metformin Has implement diet modifications as directed by nutritionist Continues to struggle with exercise. No glucose check at home at this time.  Repeat hgbA1c, and BMP

## 2022-02-08 NOTE — Assessment & Plan Note (Signed)
BP at goal with valsartan BP Readings from Last 3 Encounters:  02/08/22 118/80  12/07/21 118/72  10/12/21 128/82    Repeat BMP Maintain med dose

## 2022-02-08 NOTE — Patient Instructions (Signed)
Go to lab Sent mounjaro rx Will need to stop phentermine, once you are able to start mounjaro. Maintain heart healthy diet and daily exercise  How to Increase Your Level of Physical Activity Getting regular physical activity is important for your overall health and well-being. Most people do not get enough exercise. There are easy ways to increase your level of physical activity, even if you have not been very active in the past or if you are just starting out. What are the benefits of physical activity? Physical activity has many short-term and long-term benefits. Being active on a regular basis can improve your physical and mental health as well as provide other benefits. Physical health benefits Helping you lose weight or maintain a healthy weight. Strengthening your muscles and bones. Reducing your risk of certain long-term (chronic) diseases, including heart disease, cancer, and diabetes. Being able to move around more easily and for longer periods of time without getting tired (increased endurance or stamina). Improving your ability to fight off illness (enhanced immunity). Being able to sleep better. Helping you stay healthy as you get older, including: Helping you stay mobile, or capable of walking and moving around. Preventing accidents, such as falls. Increasing life expectancy. Mental health benefits Boosting your mood and improving your self-esteem. Lowering your chance of having mental health problems, such as depression or anxiety. Helping you feel good about your body. Other benefits Finding new sources of fun and enjoyment. Meeting new people who share a common interest. Before you begin If you have a chronic illness or have not been active for a while, check with your health care provider about how to get started. Ask your health care provider what activities are safe for you. Start out slowly. Walking or doing some simple chair exercises is a good place to start,  especially if you have not been active before or for a long time. Set goals that you can work toward. Ask your health care provider how much exercise is best for you. In general, most adults should: Do moderate-intensity exercise for at least 150 minutes each week (30 minutes on most days of the week) or vigorous exercise for at least 75 minutes each week, or a combination of these. Moderate-intensity exercise can include walking at a quick pace, biking, yoga, water aerobics, or gardening. Vigorous exercise involves activities that take more effort, such as jogging or running, playing sports, swimming laps, or jumping rope. Do strength exercises on at least 2 days each week. This can include weight lifting, body weight exercises, and resistance-band exercises. How to be more physically active Make a plan  Try to find activities that you enjoy. You are more likely to commit to an exercise routine if it does not feel like a chore. If you have bone or joint problems, choose low-impact exercises, like walking or swimming. Use these tips for being successful with an exercise plan: Find a workout partner for accountability. Join a group or class, such as an aerobics class, cycling class, or sports team. Make family time active. Go for a walk, bike, or swim. Include a variety of exercises each week. Consider using a fitness tracker, such as a mobile phone app or a device worn like a watch, that will count the number of steps you take each day. Many people strive to reach 10,000 steps a day. Find ways to be active in your daily routines Besides your formal exercise plans, you can find ways to do physical activity during your daily routines,  such as: Walking or biking to work or to the store. Taking the stairs instead of the elevator. Parking farther away from the door at work or at the store. Planning walking meetings. Walking around while you are on the phone. Where to find more information Centers  for Disease Control and Prevention: WorkDashboard.es President's Council on Fitness, Sports & Nutrition: www.fitness.gov ChooseMyPlate: MassVoice.es Contact a health care provider if: You have headaches, muscle aches, or joint pain that is concerning. You feel dizzy or light-headed while exercising. You faint. You feel your heart skipping, racing, or fluttering. You have chest pain while exercising. Summary Exercise benefits your mind and body at any age, even if you are just starting out. If you have a chronic illness or have not been active for a while, check with your health care provider before increasing your physical activity. Choose activities that are safe and enjoyable for you. Ask your health care provider what activities are safe for you. Start slowly. Tell your health care provider if you have problems as you start to increase your activity level. This information is not intended to replace advice given to you by your health care provider. Make sure you discuss any questions you have with your health care provider. Document Revised: 05/12/2020 Document Reviewed: 05/12/2020 Elsevier Patient Education  [place].

## 2022-02-08 NOTE — Telephone Encounter (Signed)
PA status: pending

## 2022-02-08 NOTE — Progress Notes (Signed)
Established Patient Visit  Patient: Kara Hoover Warning   DOB: February 24, 1970   52 y.o. Female  MRN: [id number] Visit Date: 02/08/2022  Subjective:    Chief Complaint  Patient presents with   Office Visit    HTN/ DM/ Hyperlipidemia  Pt fasting  Doesn't check BP or BS often  Weight loss medication switch    HPI Primary hypertension BP at goal with valsartan BP Readings from Last 3 Encounters:  02/08/22 118/80  12/07/21 118/72  10/12/21 128/82    Repeat BMP Maintain med dose  Type 2 diabetes mellitus with hyperglycemia, without long-term current use of insulin (HCC) No adverse effects with metformin Has implement diet modifications as directed by nutritionist Continues to struggle with exercise. No glucose check at home at this time.  Repeat hgbA1c, and BMP  Obesity 8lbs wieght loss in last 43months with use of phentermine Reports extreme dry mouth with med. Wt Readings from Last 3 Encounters:  02/08/22 198 lb 9.6 oz (90.1 kg)  12/28/21 201 lb (91.2 kg)  12/07/21 206 lb 12.8 oz (93.8 kg)    Stop phentermine Send mounjaro 2.5mg  Advised to incorporate daily exercise and continue diet as directed by nutritionist. F/up in 78months  Vitamin D deficiency Has completed high dose vit D x 12weeks Repeat vit. D   Reviewed medical, surgical, and social history today  Medications: Outpatient Medications Prior to Visit  Medication Sig   blood glucose meter kit and supplies KIT Dispense based on patient and insurance preference. Check glucose daily before breakfast. E11.65   glucose blood (ACCU-CHEK GUIDE) test strip 1 each by Other route daily at 12 noon. Use as instructed   metFORMIN (GLUCOPHAGE) 500 MG tablet Take 1 tablet (500 mg total) by mouth 2 (two) times daily with a meal.   phentermine 30 MG capsule Take 1 capsule (30 mg total) by mouth every morning.   valsartan (DIOVAN) 80 MG tablet Take 1 tablet (80 mg total) by mouth daily.   Vitamin D,  Ergocalciferol, (DRISDOL) 1.25 MG (50000 UNIT) CAPS capsule Take 1 capsule (50,000 Units total) by mouth every 7 (seven) days. (Patient not taking: Reported on 02/08/2022)   No facility-administered medications prior to visit.   Reviewed past medical and social history.   ROS per HPI above      Objective:  BP 118/80 (BP Location: Right Arm, Patient Position: Sitting, Cuff Size: Normal)   Pulse 83   Temp (!) 97 F (36.1 C) (Temporal)   Ht 5\' 5"  (1.651 m)   Wt 198 lb 9.6 oz (90.1 kg)   SpO2 98%   BMI 33.05 kg/m      Physical Exam Vitals reviewed.  Neck:     Thyroid: No thyroid mass, thyromegaly or thyroid tenderness.  Cardiovascular:     Rate and Rhythm: Normal rate and regular rhythm.     Pulses: Normal pulses.     Heart sounds: Normal heart sounds.  Pulmonary:     Effort: Pulmonary effort is normal.     Breath sounds: Normal breath sounds.  Musculoskeletal:     Right lower leg: No edema.     Left lower leg: No edema.  Neurological:     Mental Status: She is alert and oriented to person, place, and time.  Psychiatric:        Mood and Affect: Mood normal.        Behavior: Behavior normal.  Thought Content: Thought content normal.     No results found for any visits on 02/08/22.    Assessment & Plan:    Problem List Items Addressed This Visit       Cardiovascular and Mediastinum   Primary hypertension - Primary    BP at goal with valsartan BP Readings from Last 3 Encounters:  02/08/22 118/80  12/07/21 118/72  10/12/21 128/82    Repeat BMP Maintain med dose      Relevant Medications   tirzepatide (MOUNJARO) 2.5 MG/0.5ML Pen   Other Relevant Orders   Renal Function Panel     Endocrine   Type 2 diabetes mellitus with hyperglycemia, without long-term current use of insulin (HCC)    No adverse effects with metformin Has implement diet modifications as directed by nutritionist Continues to struggle with exercise. No glucose check at home at this  time.  Repeat hgbA1c, and BMP      Relevant Medications   tirzepatide (MOUNJARO) 2.5 MG/0.5ML Pen   Other Relevant Orders   Hemoglobin A1c     Other   Obesity    8lbs wieght loss in last 2months with use of phentermine Reports extreme dry mouth with med. Wt Readings from Last 3 Encounters:  02/08/22 198 lb 9.6 oz (90.1 kg)  12/28/21 201 lb (91.2 kg)  12/07/21 206 lb 12.8 oz (93.8 kg)    Stop phentermine Send mounjaro 2.5mg  Advised to incorporate daily exercise and continue diet as directed by nutritionist. F/up in 56months      Relevant Medications   tirzepatide (MOUNJARO) 2.5 MG/0.5ML Pen   Vitamin D deficiency    Has completed high dose vit D x 12weeks Repeat vit. D      Relevant Orders   VITAMIN D 25 Hydroxy (Vit-D Deficiency, Fractures)   Other Visit Diagnoses     Hyperlipidemia associated with type 2 diabetes mellitus ([place])       Relevant Medications   tirzepatide (MOUNJARO) 2.5 MG/0.5ML Pen   Other Relevant Orders   Direct LDL   Colon cancer screening       Relevant Orders   Ambulatory referral to Gastroenterology     She plans to schedule appt with Gyn for repeat PAP smear. Advised to have results faxed to me once completed   Return in about 3 months (around 05/10/2022) for DM, HTN.     Wilfred Lacy, NP

## 2022-02-08 NOTE — Assessment & Plan Note (Addendum)
8lbs wieght loss in last 40months with use of phentermine Reports extreme dry mouth with med. Wt Readings from Last 3 Encounters:  02/08/22 198 lb 9.6 oz (90.1 kg)  12/28/21 201 lb (91.2 kg)  12/07/21 206 lb 12.8 oz (93.8 kg)    Stop phentermine Send mounjaro 2.5mg  Advised to incorporate daily exercise and continue diet as directed by nutritionist. F/up in 48months

## 2022-02-10 DIAGNOSIS — E1169 Type 2 diabetes mellitus with other specified complication: Secondary | ICD-10-CM | POA: Insufficient documentation

## 2022-02-10 MED ORDER — ATORVASTATIN CALCIUM 10 MG PO TABS: 10.0000 mg | ORAL_TABLET | Freq: Every day | ORAL | 3 refills | Status: DC

## 2022-02-11 NOTE — Telephone Encounter (Signed)
Patient Advocate Encounter   Received notification from [facility] that prior authorization for Mounjaro 2.5MG /0.5ML is required. PA submitted on 02/11/2022 Key [id number] Status is pending

## 2022-02-12 ENCOUNTER — Other Ambulatory Visit: Payer: Self-pay

## 2022-02-12 DIAGNOSIS — E1169 Type 2 diabetes mellitus with other specified complication: Secondary | ICD-10-CM

## 2022-02-12 MED ORDER — ATORVASTATIN CALCIUM 10 MG PO TABS: 10.0000 mg | ORAL_TABLET | Freq: Every day | ORAL | 3 refills | Status: DC

## 2022-02-22 ENCOUNTER — Encounter: Payer: Self-pay | Admitting: Nurse Practitioner

## 2022-02-22 DIAGNOSIS — Z01419 Encounter for gynecological examination (general) (routine) without abnormal findings: Secondary | ICD-10-CM | POA: Diagnosis not present

## 2022-02-22 DIAGNOSIS — Z1151 Encounter for screening for human papillomavirus (HPV): Secondary | ICD-10-CM | POA: Diagnosis not present

## 2022-02-22 DIAGNOSIS — Z124 Encounter for screening for malignant neoplasm of cervix: Secondary | ICD-10-CM | POA: Diagnosis not present

## 2022-03-14 ENCOUNTER — Encounter: Payer: Self-pay | Admitting: Nurse Practitioner

## 2022-03-29 ENCOUNTER — Ambulatory Visit: Payer: BC Managed Care – PPO | Admitting: Dietician

## 2022-05-03 ENCOUNTER — Ambulatory Visit: Payer: BC Managed Care – PPO | Admitting: Nurse Practitioner

## 2022-05-10 ENCOUNTER — Ambulatory Visit: Payer: BC Managed Care – PPO | Admitting: Nurse Practitioner

## 2022-05-17 ENCOUNTER — Ambulatory Visit (INDEPENDENT_AMBULATORY_CARE_PROVIDER_SITE_OTHER): Payer: BC Managed Care – PPO | Admitting: Nurse Practitioner

## 2022-05-17 ENCOUNTER — Encounter: Payer: Self-pay | Admitting: Nurse Practitioner

## 2022-05-17 VITALS — BP 130/78 | HR 97 | Temp 97.6°F | Wt 175.4 lb

## 2022-05-17 DIAGNOSIS — I10 Essential (primary) hypertension: Secondary | ICD-10-CM | POA: Diagnosis not present

## 2022-05-17 DIAGNOSIS — E1165 Type 2 diabetes mellitus with hyperglycemia: Secondary | ICD-10-CM | POA: Diagnosis not present

## 2022-05-17 DIAGNOSIS — E785 Hyperlipidemia, unspecified: Secondary | ICD-10-CM

## 2022-05-17 DIAGNOSIS — Z6830 Body mass index (BMI) 30.0-30.9, adult: Secondary | ICD-10-CM

## 2022-05-17 DIAGNOSIS — E6609 Other obesity due to excess calories: Secondary | ICD-10-CM

## 2022-05-17 DIAGNOSIS — E1169 Type 2 diabetes mellitus with other specified complication: Secondary | ICD-10-CM | POA: Diagnosis not present

## 2022-05-17 DIAGNOSIS — E875 Hyperkalemia: Secondary | ICD-10-CM

## 2022-05-17 LAB — COMPREHENSIVE METABOLIC PANEL
ALT: 19 U/L (ref 0–35)
AST: 15 U/L (ref 0–37)
Albumin: 4.5 g/dL (ref 3.5–5.2)
Alkaline Phosphatase: 94 U/L (ref 39–117)
BUN: 9 mg/dL (ref 6–23)
CO2: 28 mEq/L (ref 19–32)
Calcium: 10.3 mg/dL (ref 8.4–10.5)
Chloride: 109 mEq/L (ref 96–112)
Creatinine, Ser: 1.13 mg/dL (ref 0.40–1.20)
GFR: 55.56 mL/min — ABNORMAL LOW (ref >60.00)
Glucose, Bld: 102 mg/dL — ABNORMAL HIGH (ref 70–99)
Potassium: 5.5 mEq/L — ABNORMAL HIGH (ref 3.5–5.1)
Sodium: 150 mEq/L — ABNORMAL HIGH (ref 135–145)
Total Bilirubin: 0.7 mg/dL (ref 0.2–1.2)
Total Protein: 7 g/dL (ref 6.0–8.3)

## 2022-05-17 LAB — LIPID PANEL
Cholesterol: 116 mg/dL (ref 0–200)
HDL: 42.1 mg/dL (ref >39.00)
LDL Cholesterol: 54 mg/dL (ref 0–99)
NonHDL: 73.56
Total CHOL/HDL Ratio: 3
Triglycerides: 96 mg/dL (ref 0.0–149.0)
VLDL: 19.2 mg/dL (ref 0.0–40.0)

## 2022-05-17 LAB — HEMOGLOBIN A1C: Hgb A1c MFr Bld: 5.6 % (ref 4.6–6.5)

## 2022-05-17 NOTE — Addendum Note (Signed)
Addended by: Alysia Penna L on: 05/17/2022 03:55 PM   Modules accepted: Orders

## 2022-05-17 NOTE — Progress Notes (Signed)
Established Patient Visit  Patient: Kara Hoover   DOB: Feb 18, 1970   52 y.o. Female  MRN: [id number] Visit Date: 05/17/2022  Subjective:    Chief Complaint  Patient presents with   Medical Management of Chronic Issues    3 month follow up DM & HTN. Fasting   HPI Obesity No adverse effects with mounjario Lost 23lbs in last 3months Lost total of 38lbs in last 8months. Has maintained low fat/low carb diet and daily exercise (cardio and weight training) Wt Readings from Last 3 Encounters:  05/17/22 175 lb 6.4 oz (79.6 kg)  02/08/22 198 lb 9.6 oz (90.1 kg)  12/28/21 201 lb (91.2 kg)    Maintain med dose  Hyperlipidemia associated with type 2 diabetes mellitus (HCC) Repeat lipid panel and CMP Maintain atorvastatin dose  Type 2 diabetes mellitus with hyperglycemia, without long-term current use of insulin (HCC) Repeat hgbA1c Denies any adverse effects with mounjaro Negative neuropathy or nephropathy or DM retinopathy. Maintain med dose F/up in 6months  Primary hypertension BP at goal with valsartan BP Readings from Last 3 Encounters:  05/17/22 130/78  02/08/22 118/80  12/07/21 118/72    Maintain med dose Repeat CMP  Reviewed medical, surgical, and social history today  Medications: Outpatient Medications Prior to Visit  Medication Sig   atorvastatin (LIPITOR) 10 MG tablet Take 1 tablet (10 mg total) by mouth at bedtime.   blood glucose meter kit and supplies KIT Dispense based on patient and insurance preference. Check glucose daily before breakfast. E11.65   glucose blood (ACCU-CHEK GUIDE) test strip 1 each by Other route daily at 12 noon. Use as instructed   metFORMIN (GLUCOPHAGE) 500 MG tablet Take 1 tablet (500 mg total) by mouth 2 (two) times daily with a meal.   tirzepatide (MOUNJARO) 2.5 MG/0.5ML Pen Inject 2.5 mg into the skin once a week.   valsartan (DIOVAN) 80 MG tablet Take 1 tablet (80 mg total) by mouth daily.   VITAMIN D PO Take  by mouth. OTC   [DISCONTINUED] Vitamin D, Ergocalciferol, (DRISDOL) 1.25 MG (50000 UNIT) CAPS capsule Take 1 capsule (50,000 Units total) by mouth every 7 (seven) days. (Patient not taking: Reported on 05/17/2022)   No facility-administered medications prior to visit.   Reviewed past medical and social history.   ROS per HPI above      Objective:  BP 130/78   Pulse 97   Temp 97.6 F (36.4 C) (Temporal)   Wt 175 lb 6.4 oz (79.6 kg)   SpO2 96%   BMI 29.19 kg/m      Physical Exam Cardiovascular:     Rate and Rhythm: Normal rate and regular rhythm.     Pulses: Normal pulses.     Heart sounds: Normal heart sounds.  Pulmonary:     Effort: Pulmonary effort is normal.     Breath sounds: Normal breath sounds.  Musculoskeletal:     Right lower leg: No edema.     Left lower leg: No edema.  Neurological:     Mental Status: She is alert and oriented to person, place, and time.  Psychiatric:        Mood and Affect: Mood normal.        Behavior: Behavior normal.        Thought Content: Thought content normal.     No results found for any visits on 05/17/22.    Assessment & Plan:  Problem List Items Addressed This Visit       Cardiovascular and Mediastinum   Primary hypertension - Primary    BP at goal with valsartan BP Readings from Last 3 Encounters:  05/17/22 130/78  02/08/22 118/80  12/07/21 118/72    Maintain med dose Repeat CMP      Relevant Orders   Comprehensive metabolic panel     Endocrine   Hyperlipidemia associated with type 2 diabetes mellitus    Repeat lipid panel and CMP Maintain atorvastatin dose      Relevant Orders   Lipid panel   Comprehensive metabolic panel   Type 2 diabetes mellitus with hyperglycemia, without long-term current use of insulin    Repeat hgbA1c Denies any adverse effects with mounjaro Negative neuropathy or nephropathy or DM retinopathy. Maintain med dose F/up in 6months      Relevant Orders   Hemoglobin A1c    Comprehensive metabolic panel     Other   Obesity    No adverse effects with mounjario Lost 23lbs in last 3months Lost total of 38lbs in last 8months. Has maintained low fat/low carb diet and daily exercise (cardio and weight training) Wt Readings from Last 3 Encounters:  05/17/22 175 lb 6.4 oz (79.6 kg)  02/08/22 198 lb 9.6 oz (90.1 kg)  12/28/21 201 lb (91.2 kg)    Maintain med dose      Return in about 6 months (around 11/16/2022) for CPE (fasting).     Alysia Penna, NP

## 2022-05-17 NOTE — Assessment & Plan Note (Addendum)
BP at goal with valsartan BP Readings from Last 3 Encounters:  05/17/22 130/78  02/08/22 118/80  12/07/21 118/72    Maintain med dose Repeat CMP

## 2022-05-17 NOTE — Patient Instructions (Signed)
Go to lab Maintain current medications. 

## 2022-05-17 NOTE — Assessment & Plan Note (Addendum)
No adverse effects with mounjario Lost 23lbs in last 3months Lost total of 38lbs in last 8months. Has maintained low fat/low carb diet and daily exercise (cardio and weight training) Wt Readings from Last 3 Encounters:  05/17/22 175 lb 6.4 oz (79.6 kg)  02/08/22 198 lb 9.6 oz (90.1 kg)  12/28/21 201 lb (91.2 kg)    Maintain med dose

## 2022-05-17 NOTE — Assessment & Plan Note (Signed)
Repeat hgbA1c Denies any adverse effects with mounjaro Negative neuropathy or nephropathy or DM retinopathy. Maintain med dose F/up in 6months

## 2022-05-17 NOTE — Assessment & Plan Note (Addendum)
Repeat lipid panel and CMP Maintain atorvastatin dose 

## 2022-05-24 ENCOUNTER — Encounter: Payer: Self-pay | Admitting: Nurse Practitioner

## 2022-05-24 ENCOUNTER — Other Ambulatory Visit: Payer: Self-pay

## 2022-05-24 DIAGNOSIS — E1165 Type 2 diabetes mellitus with hyperglycemia: Secondary | ICD-10-CM

## 2022-05-24 MED ORDER — METFORMIN HCL 500 MG PO TABS: 500.0000 mg | ORAL_TABLET | Freq: Two times a day (BID) | ORAL | 3 refills | Status: DC

## 2022-05-31 ENCOUNTER — Encounter: Payer: BC Managed Care – PPO | Admitting: Nurse Practitioner

## 2022-05-31 DIAGNOSIS — E875 Hyperkalemia: Secondary | ICD-10-CM

## 2022-05-31 LAB — BASIC METABOLIC PANEL
BUN: 10 mg/dL (ref 6–23)
CO2: 27 mEq/L (ref 19–32)
Calcium: 9.2 mg/dL (ref 8.4–10.5)
Chloride: 107 mEq/L (ref 96–112)
Creatinine, Ser: 1 mg/dL (ref 0.40–1.20)
GFR: 64.32 mL/min (ref >60.00)
Glucose, Bld: 103 mg/dL — ABNORMAL HIGH (ref 70–99)
Potassium: 4.5 mEq/L (ref 3.5–5.1)
Sodium: 143 mEq/L (ref 135–145)

## 2022-05-31 NOTE — Progress Notes (Signed)
Stable Follow instructions as discussed during office visit.

## 2022-06-04 NOTE — Progress Notes (Signed)
She was not seen Suppose to be lab appt This encounter was created in error - please disregard. This encounter was created in error - please disregard.

## 2022-06-10 ENCOUNTER — Encounter: Payer: Self-pay | Admitting: Nurse Practitioner

## 2022-06-10 ENCOUNTER — Other Ambulatory Visit: Payer: Self-pay

## 2022-06-11 ENCOUNTER — Other Ambulatory Visit: Payer: Self-pay

## 2022-06-11 ENCOUNTER — Other Ambulatory Visit (HOSPITAL_COMMUNITY): Payer: Self-pay

## 2022-06-11 DIAGNOSIS — E1169 Type 2 diabetes mellitus with other specified complication: Secondary | ICD-10-CM

## 2022-06-11 DIAGNOSIS — I10 Essential (primary) hypertension: Secondary | ICD-10-CM

## 2022-06-11 DIAGNOSIS — E1165 Type 2 diabetes mellitus with hyperglycemia: Secondary | ICD-10-CM

## 2022-06-11 MED ORDER — TIRZEPATIDE 2.5 MG/0.5ML ~~LOC~~ SOAJ
2.5000 mg | SUBCUTANEOUS | 1 refills | Status: DC
Start: 2022-06-11 — End: 2022-11-18
  Filled 2022-09-09: qty 2, 28d supply, fill #0
  Filled 2022-10-02: qty 2, 28d supply, fill #1
  Filled 2022-10-28: qty 2, 28d supply, fill #2

## 2022-06-13 ENCOUNTER — Encounter: Payer: Self-pay | Admitting: Gastroenterology

## 2022-07-04 ENCOUNTER — Ambulatory Visit: Payer: BC Managed Care – PPO

## 2022-07-04 ENCOUNTER — Telehealth: Payer: Self-pay

## 2022-07-04 NOTE — Telephone Encounter (Signed)
Multiple attempts made to reach patient for Pre Visit appt; Unable to reach patient and also unable to leave a voicemail;  Will attempt to reach patient at a later time;  If unable to reach patient and patient does not call back to the office prior to 5 pm, the PV and procedure appts will be cancelled and a no show letter will be sent to the patient;

## 2022-07-05 ENCOUNTER — Encounter: Payer: Self-pay | Admitting: Nurse Practitioner

## 2022-07-06 ENCOUNTER — Encounter (HOSPITAL_BASED_OUTPATIENT_CLINIC_OR_DEPARTMENT_OTHER): Payer: Self-pay

## 2022-07-06 ENCOUNTER — Emergency Department (HOSPITAL_BASED_OUTPATIENT_CLINIC_OR_DEPARTMENT_OTHER)
Admission: EM | Admit: 2022-07-06 | Discharge: 2022-07-06 | Disposition: A | Discharge status: Home / Self Care | Payer: BC Managed Care – PPO | Attending: Emergency Medicine | Admitting: Emergency Medicine

## 2022-07-06 ENCOUNTER — Emergency Department (HOSPITAL_BASED_OUTPATIENT_CLINIC_OR_DEPARTMENT_OTHER): Payer: BC Managed Care – PPO | Admitting: Radiology

## 2022-07-06 DIAGNOSIS — Z79899 Other long term (current) drug therapy: Secondary | ICD-10-CM | POA: Diagnosis not present

## 2022-07-06 DIAGNOSIS — I10 Essential (primary) hypertension: Secondary | ICD-10-CM | POA: Insufficient documentation

## 2022-07-06 DIAGNOSIS — Z1152 Encounter for screening for COVID-19: Secondary | ICD-10-CM | POA: Diagnosis not present

## 2022-07-06 DIAGNOSIS — Z7984 Long term (current) use of oral hypoglycemic drugs: Secondary | ICD-10-CM | POA: Diagnosis not present

## 2022-07-06 DIAGNOSIS — E119 Type 2 diabetes mellitus without complications: Secondary | ICD-10-CM | POA: Diagnosis not present

## 2022-07-06 DIAGNOSIS — R509 Fever, unspecified: Secondary | ICD-10-CM | POA: Diagnosis not present

## 2022-07-06 LAB — URINALYSIS, ROUTINE W REFLEX MICROSCOPIC
Bacteria, UA: NONE SEEN
Bilirubin Urine: NEGATIVE
Glucose, UA: NEGATIVE mg/dL
Ketones, ur: NEGATIVE mg/dL
Nitrite: NEGATIVE
Protein, ur: 30 mg/dL — AB
Specific Gravity, Urine: 1.03 (ref 1.005–1.030)
pH: 6.5 (ref 5.0–8.0)

## 2022-07-06 LAB — SARS CORONAVIRUS 2 BY RT PCR: SARS Coronavirus 2 by RT PCR: NEGATIVE

## 2022-07-06 MED ORDER — IBUPROFEN 400 MG PO TABS
600.0000 mg | ORAL_TABLET | Freq: Once | ORAL | Status: AC
  Administered 2022-07-06: 600 mg via ORAL
  Filled 2022-07-06: qty 1

## 2022-07-06 NOTE — ED Provider Notes (Signed)
[place] EMERGENCY DEPARTMENT AT [facility] Provider Note   CSN: [id number] Arrival date & time: 07/06/22  0730     History  Chief Complaint  Patient presents with   Fever    Kara Hoover is a 52 y.o. female.  With PMH of DM2, HTN, HLD who presents with fever and chills since yesterday.  Patient works at home and denies any sick contacts.  She has no other infectious symptoms.  She felt hot and chills yesterday took her temperature and found it was 100.4 F.  Took it again it was 100.8 F.  She has no other symptoms.  She has had no rashes, no vomiting, no diarrhea, no abdominal pain, no dysuria, no coughing, no congestion, no sore throat, no ear pain.  She has not been outdoors and denies any tick borne exposures.  She wanted to to be checked out because the high temperature was concerning her.  She has been taking Tylenol and ibuprofen for fever.   Fever      Home Medications Prior to Admission medications   Medication Sig Start Date End Date Taking? Authorizing Provider  atorvastatin (LIPITOR) 10 MG tablet Take 1 tablet (10 mg total) by mouth at bedtime. 02/12/22   Nche, Bonna Gains, NP  blood glucose meter kit and supplies KIT Dispense based on patient and insurance preference. Check glucose daily before breakfast. E11.65 12/07/21   Nche, Bonna Gains, NP  glucose blood (ACCU-CHEK GUIDE) test strip 1 each by Other route daily at 12 noon. Use as instructed 12/13/21   Nche, Bonna Gains, NP  metFORMIN (GLUCOPHAGE) 500 MG tablet Take 1 tablet (500 mg total) by mouth 2 (two) times daily with a meal. 05/24/22   Nche, Bonna Gains, NP  tirzepatide [facility]) 2.5 MG/0.5ML Pen Inject 2.5 mg into the skin once a week. 06/11/22   Nche, Bonna Gains, NP  valsartan (DIOVAN) 80 MG tablet Take 1 tablet (80 mg total) by mouth daily. 02/01/22   Nche, Bonna Gains, NP  VITAMIN D PO Take by mouth. OTC    [provider]      Allergies    Patient has no known  allergies.    Review of Systems   Review of Systems  Constitutional:  Positive for fever.    Physical Exam Updated Vital Signs BP (!) 138/93   Pulse 90   Temp 99.3 F (37.4 C) (Oral)   Resp 20   Ht 5\' 5"  (1.651 m)   Wt 75.3 kg   SpO2 97%   BMI 27.62 kg/m  Physical Exam Constitutional: Alert and oriented. Well appearing and in no distress. Eyes: Conjunctivae are normal. ENT      Head: Normocephalic and atraumatic.      Nose: No congestion.      Mouth/Throat: Mucous membranes are moist.  Uvula midline.  No oropharynx erythema or exudate.      Neck: No stridor.  No meningismus. Cardiovascular: S1, S2,  Normal and symmetric distal pulses are present in all extremities.Warm and well perfused. Respiratory: Normal respiratory effort. Breath sounds are normal.  O2 sat 97 on RA Gastrointestinal: Soft and nontender. There is no CVA tenderness. Musculoskeletal: Normal range of motion in all extremities. Neurologic: Normal speech and language.  No facial droop.  Moving all 4 extremities equally.  Sensation grossly intact.  No gross focal neurologic deficits are appreciated. Skin: Skin is warm, dry and intact. No rash noted. Psychiatric: Mood and affect are normal. Speech and behavior are normal.  ED Results / Procedures / Treatments   Labs (all labs ordered are listed, but only abnormal results are displayed) Labs Reviewed  URINALYSIS, ROUTINE W REFLEX MICROSCOPIC - Abnormal; Notable for the following components:      Result Value   Hgb urine dipstick SMALL (*)    Protein, ur 30 (*)    Leukocytes,Ua TRACE (*)    All other components within normal limits  SARS CORONAVIRUS 2 BY RT PCR    EKG None  Radiology DG Chest 2 View  Result Date: 07/06/2022 CLINICAL DATA:  Fever. EXAM: CHEST - 2 VIEW COMPARISON:  09/02/2019 FINDINGS: The lungs are clear without focal pneumonia, edema, pneumothorax or pleural effusion. The cardiopericardial silhouette is within normal limits for size. No  acute bony abnormality. IMPRESSION: No active cardiopulmonary disease. Electronically Signed   By: Kennith Center M.D.   On: 07/06/2022 09:38    Procedures Procedures    Medications Ordered in ED Medications  ibuprofen (ADVIL) tablet 600 mg (600 mg Oral Given 07/06/22 1023)    ED Course/ Medical Decision Making/ A&P                             Medical Decision Making Kara Hoover is a 52 y.o. female.  With PMH of DM2, HTN, HLD who presents with fever and chills since yesterday.   Patient has had low-grade fever with no associated symptoms for 1 day.  I suspect most likely viral etiology including but not limited to COVID RSV or influenza especially with high prevalence in the community.  Her lung sounds are clear and has no respiratory complaints additionally had a chest x-ray performed which I personally reviewed showing no evidence of pneumonia.  I am clinically not concern for meningitis with well appearance no neurologic symptoms no headache no neck pain.  No tick borne exposures per patient.  There is no rash on exam.  Her abdominal exam is benign, I am not concerned for intra-abdominal pathology requiring imaging today.   Obtained a UA which had small hemoglobin trace leukocyte esterase and 11-20 RBC but no WBCs no bacteria, not consistent with UTI.  Will allow for reflex culture to result.  Discussed with patient unrevealing workup today and close follow-up with PCP.  Continue Tylenol and ibuprofen for fever and aches.  Return precautions discussed.  She is in agreement with plan and discharged in good condition.  Amount and/or Complexity of Data Reviewed Labs: ordered. Radiology: ordered.      Final Clinical Impression(s) / ED Diagnoses Final diagnoses:  Fever, unspecified fever cause    Rx / DC Orders ED Discharge Orders     None         Mardene Sayer, MD 07/06/22 1153

## 2022-07-06 NOTE — Discharge Instructions (Addendum)
Take Tylenol 1 g every 6-8 hours and ibuprofen 400 to 600 mg every 4-6 hours for fever or aches.  Follow-up with your primary care doctor within the next 2 to 3 days for reevaluation.  You will be called if your urine culture shows evidence of bacterial infection and be sent an antibiotic.  Come back if any severe shortness of breath, coughing, vomiting, abdominal pain, chest pain, rash, or any other symptoms concerning to you.

## 2022-07-06 NOTE — ED Triage Notes (Signed)
Pt POV from home c/o fever onset yesterday around noon. Pt denies c/o illness aside from feeling weak. Denies NVD, pain. NAD during triage

## 2022-07-08 ENCOUNTER — Telehealth: Payer: Self-pay

## 2022-07-08 NOTE — Transitions of Care (Post Inpatient/ED Visit) (Unsigned)
   07/08/2022  Name: Kara Hoover MRN: [id number] DOB: 1970-11-09  Today's TOC FU Call Status: Today's TOC FU Call Status:: Unsuccessul Call (1st Attempt) Unsuccessful Call (1st Attempt) Date: 07/08/22  Attempted to reach the patient regarding the most recent Inpatient/ED visit.  Follow Up Plan: Additional outreach attempts will be made to reach the patient to complete the Transitions of Care (Post Inpatient/ED visit) call.   Signature Arvil Persons, BSN, Charity fundraiser

## 2022-07-09 NOTE — Transitions of Care (Post Inpatient/ED Visit) (Unsigned)
   07/09/2022  Name: Kara Hoover MRN: [id number] DOB: 1970/08/06  Today's TOC FU Call Status: Today's TOC FU Call Status:: Unsuccessful Call (2nd Attempt) Unsuccessful Call (1st Attempt) Date: 07/08/22 Unsuccessful Call (2nd Attempt) Date: 07/09/22  Attempted to reach the patient regarding the most recent Inpatient/ED visit.  Follow Up Plan: Additional outreach attempts will be made to reach the patient to complete the Transitions of Care (Post Inpatient/ED visit) call.   Signature Arvil Persons, BSN, Charity fundraiser

## 2022-07-11 ENCOUNTER — Ambulatory Visit (AMBULATORY_SURGERY_CENTER): Payer: BC Managed Care – PPO

## 2022-07-11 VITALS — Ht 65.0 in | Wt 165.0 lb

## 2022-07-11 DIAGNOSIS — Z1211 Encounter for screening for malignant neoplasm of colon: Secondary | ICD-10-CM

## 2022-07-11 MED ORDER — NA SULFATE-K SULFATE-MG SULF 17.5-3.13-1.6 GM/177ML PO SOLN: 1.0000 | Freq: Once | ORAL | 0 refills | Status: AC

## 2022-07-11 NOTE — Transitions of Care (Post Inpatient/ED Visit) (Signed)
   07/11/2022  Name: Miranda Frese Coggin MRN: [id number] DOB: 10/19/1970  Today's TOC FU Call Status: Today's TOC FU Call Status:: Unsuccessful Call (3rd Attempt) Unsuccessful Call (1st Attempt) Date: 07/08/22 Unsuccessful Call (2nd Attempt) Date: 07/09/22 Unsuccessful Call (3rd Attempt) Date: 07/11/22  Attempted to reach the patient regarding the most recent Inpatient/ED visit.  Follow Up Plan: No further outreach attempts will be made at this time. We have been unable to contact the patient.  Signature Arvil Persons, BSN, Charity fundraiser

## 2022-07-11 NOTE — Progress Notes (Signed)

## 2022-07-19 ENCOUNTER — Telehealth: Payer: Self-pay | Admitting: Gastroenterology

## 2022-07-19 NOTE — Telephone Encounter (Signed)
Returned call to patient. Advised her to hold [facility] as instructed on the colon prep instructions stopping medication on the 07/17/22. She agreed to POC.

## 2022-07-19 NOTE — Telephone Encounter (Signed)
Inbound call from patient wishing to be advised on Mounjaro injections. States she was told during her pre visit that she would be able to have injection Wednesday 6/26 before her 6/27 colonoscopy. States when she received her prep instructions through the mail it highlight to hold off on injections 7 days prior to procedure and last dose would be 6/19. Requesting a call back to clarify instructions. Please advise, thank you.

## 2022-07-20 ENCOUNTER — Encounter: Payer: Self-pay | Admitting: Certified Registered Nurse Anesthetist

## 2022-07-25 ENCOUNTER — Ambulatory Visit (AMBULATORY_SURGERY_CENTER): Payer: BC Managed Care – PPO | Admitting: Gastroenterology

## 2022-07-25 ENCOUNTER — Encounter: Payer: Self-pay | Admitting: Gastroenterology

## 2022-07-25 VITALS — BP 120/80 | HR 68 | Temp 97.8°F | Resp 18 | Ht 65.0 in | Wt 165.0 lb

## 2022-07-25 DIAGNOSIS — Z1211 Encounter for screening for malignant neoplasm of colon: Secondary | ICD-10-CM

## 2022-07-25 DIAGNOSIS — K641 Second degree hemorrhoids: Secondary | ICD-10-CM

## 2022-07-25 DIAGNOSIS — K5730 Diverticulosis of large intestine without perforation or abscess without bleeding: Secondary | ICD-10-CM

## 2022-07-25 MED ORDER — SODIUM CHLORIDE 0.9 % IV SOLN
500.0000 mL | Freq: Once | INTRAVENOUS | Status: DC
Start: 2022-07-25 — End: 2022-07-25

## 2022-07-25 NOTE — Op Note (Signed)
[facility] Patient Name: Kara Hoover Procedure Date: 07/25/2022 9:00 AM MRN: [id number] Endoscopist: Doristine Locks , MD, [id number] Age: 52 Referring MD:  Date of Birth: 12-18-1970 Gender: Female Account #: [id number] Procedure:                Colonoscopy Indications:              Screening for colorectal malignant neoplasm, This                            is the patient's first colonoscopy Medicines:                Monitored Anesthesia Care Procedure:                Pre-Anesthesia Assessment:                           - Prior to the procedure, a History and Physical                            was performed, and patient medications and                            allergies were reviewed. The patient's tolerance of                            previous anesthesia was also reviewed. The risks                            and benefits of the procedure and the sedation                            options and risks were discussed with the patient.                            All questions were answered, and informed consent                            was obtained. Prior Anticoagulants: The patient has                            taken no anticoagulant or antiplatelet agents. ASA                            Grade Assessment: II - A patient with mild systemic                            disease. After reviewing the risks and benefits,                            the patient was deemed in satisfactory condition to                            undergo the procedure.  After obtaining informed consent, the colonoscope                            was passed under direct vision. Throughout the                            procedure, the patient's blood pressure, pulse, and                            oxygen saturations were monitored continuously. The                            Olympus [id number]-[id number]) Colonoscope was                            introduced through  the anus and advanced to the the                            cecum, identified by appendiceal orifice and                            ileocecal valve. The colonoscopy was performed                            without difficulty. The patient tolerated the                            procedure well. The quality of the bowel                            preparation was excellent. The ileocecal valve,                            appendiceal orifice, and rectum were photographed. Scope In: 9:14:55 AM Scope Out: 9:28:59 AM Scope Withdrawal Time: 0 hours 10 minutes 6 seconds  Total Procedure Duration: 0 hours 14 minutes 4 seconds  Findings:                 Hemorrhoids were found on perianal exam.                           Multiple large-mouthed and small-mouthed                            diverticula were found in the transverse colon and                            ascending colon.                           Non-bleeding internal hemorrhoids were found during                            retroflexion. The hemorrhoids were small and Grade  II (internal hemorrhoids that prolapse but reduce                            spontaneously).                           The ascending colon revealed moderately excessive                            looping. Advancing the scope required using manual                            pressure. Complications:            No immediate complications. Estimated Blood Loss:     Estimated blood loss: none. Impression:               - Hemorrhoids found on perianal exam.                           - Diverticulosis in the transverse colon and in the                            ascending colon.                           - Non-bleeding internal hemorrhoids.                           - There was significant looping of the colon.                           - No specimens collected. Recommendation:           - Patient has a contact number available for                             emergencies. The signs and symptoms of potential                            delayed complications were discussed with the                            patient. Return to normal activities tomorrow.                            Written discharge instructions were provided to the                            patient.                           - Resume previous diet.                           - Continue present medications.                           -  Repeat colonoscopy in 10 years for screening                            purposes.                           - Return to GI office PRN.                           - Internal hemorrhoids were noted on this study and                            may be amenable to hemorrhoid band ligation. If you                            are interested in further treatment of these                            hemorrhoids with band ligation, please contact my                            clinic to set up an appointment for evaluation and                            treatment. Doristine Locks, MD 07/25/2022 9:33:16 AM

## 2022-07-25 NOTE — Progress Notes (Signed)
GASTROENTEROLOGY PROCEDURE H&P NOTE   Primary Care Physician: Anne Ng, NP    Reason for Procedure:  Colon Cancer screening  Plan:    Colonoscopy  Patient is appropriate for endoscopic procedure(s) in the ambulatory ([facility]) setting.  The nature of the procedure, as well as the risks, benefits, and alternatives were carefully and thoroughly reviewed with the patient. Ample time for discussion and questions allowed. The patient understood, was satisfied, and agreed to proceed.     HPI: Kara Hoover is a 52 y.o. female who presents for colonoscopy for routine Colon Cancer screening.  No active GI symptoms.  No known family history of colon cancer or related malignancy.  Patient is otherwise without complaints or active issues today.  Past Medical History:  Diagnosis Date   Diabetes mellitus without complication (HCC)    Hypertension     Past Surgical History:  Procedure Laterality Date   ABDOMINAL HYSTERECTOMY     AUGMENTATION MAMMAPLASTY     TUMOR REMOVAL      Prior to Admission medications   Medication Sig Start Date End Date Taking? Authorizing Provider  atorvastatin (LIPITOR) 10 MG tablet Take 1 tablet (10 mg total) by mouth at bedtime. 02/12/22  Yes Nche, Bonna Gains, NP  blood glucose meter kit and supplies KIT Dispense based on patient and insurance preference. Check glucose daily before breakfast. E11.65 12/07/21  Yes Nche, Bonna Gains, NP  glucose blood (ACCU-CHEK GUIDE) test strip 1 each by Other route daily at 12 noon. Use as instructed 12/13/21  Yes Nche, Bonna Gains, NP  metFORMIN (GLUCOPHAGE) 500 MG tablet Take 1 tablet (500 mg total) by mouth 2 (two) times daily with a meal. 05/24/22  Yes Nche, Bonna Gains, NP  valsartan (DIOVAN) 80 MG tablet Take 1 tablet (80 mg total) by mouth daily. 02/01/22  Yes Nche, Bonna Gains, NP  VITAMIN D PO Take by mouth. OTC   Yes [provider]  tirzepatide Greggory Keen) 2.5 MG/0.5ML Pen Inject 2.5 mg  into the skin once a week. 06/11/22   Nche, Bonna Gains, NP    Current Outpatient Medications  Medication Sig Dispense Refill   atorvastatin (LIPITOR) 10 MG tablet Take 1 tablet (10 mg total) by mouth at bedtime. 90 tablet 3   blood glucose meter kit and supplies KIT Dispense based on patient and insurance preference. Check glucose daily before breakfast. E11.65 1 each 0   glucose blood (ACCU-CHEK GUIDE) test strip 1 each by Other route daily at 12 noon. Use as instructed 100 each 3   metFORMIN (GLUCOPHAGE) 500 MG tablet Take 1 tablet (500 mg total) by mouth 2 (two) times daily with a meal. 180 tablet 3   valsartan (DIOVAN) 80 MG tablet Take 1 tablet (80 mg total) by mouth daily. 90 tablet 3   VITAMIN D PO Take by mouth. OTC     tirzepatide (MOUNJARO) 2.5 MG/0.5ML Pen Inject 2.5 mg into the skin once a week. 6 mL 1   Current Facility-Administered Medications  Medication Dose Route Frequency Provider Last Rate Last Admin   0.9 %  sodium chloride infusion  500 mL Intravenous Once Pleas Carneal V, DO        Allergies as of 07/25/2022   (No Known Allergies)    Family History  Problem Relation Age of Onset   Fibromyalgia Mother    Hypertension Father    Stroke Father    Cancer Other    Colon cancer Neg Hx    Colon polyps  Neg Hx    Esophageal cancer Neg Hx    Rectal cancer Neg Hx    Stomach cancer Neg Hx     Social History   Socioeconomic History   Marital status: Married    Spouse name: Not on file   Number of children: Not on file   Years of education: Not on file   Highest education level: Not on file  Occupational History   Not on file  Tobacco Use   Smoking status: Never   Smokeless tobacco: Never  Vaping Use   Vaping Use: Never used  Substance and Sexual Activity   Alcohol use: Not Currently   Drug use: No   Sexual activity: Yes    Birth control/protection: None, Surgical  Other Topics Concern   Not on file  Social History Narrative   Not on file    Social Determinants of Health   Financial Resource Strain: Not on file  Food Insecurity: Not on file  Transportation Needs: Not on file  Physical Activity: Not on file  Stress: Not on file  Social Connections: Not on file  Intimate Partner Violence: Not on file    Physical Exam: Vital signs in last 24 hours: @BP  126/61   Pulse 75   Temp 97.8 F (36.6 C) (Skin)   Ht 5\' 5"  (1.651 m)   Wt 165 lb (74.8 kg)   SpO2 99%   BMI 27.46 kg/m  GEN: NAD EYE: Sclerae anicteric ENT: MMM CV: Non-tachycardic Pulm: CTA b/l GI: Soft, NT/ND NEURO:  Alert & Oriented x 3   Doristine Locks, DO [facility]   07/25/2022 9:02 AM

## 2022-07-25 NOTE — Patient Instructions (Signed)
Handouts provided on diverticulosis and hemorrhoids.  Resume previous diet.  Continue present medications.  Repeat colonoscopy in 10 years for screening purposes.  Return to GI office as needed.   YOU HAD AN ENDOSCOPIC PROCEDURE TODAY AT THE [facility]:   Refer to the procedure report that was given to you for any specific questions about what was found during the examination.  If the procedure report does not answer your questions, please call your gastroenterologist to clarify.  If you requested that your care partner not be given the details of your procedure findings, then the procedure report has been included in a sealed envelope for you to review at your convenience later.  YOU SHOULD EXPECT: Some feelings of bloating in the abdomen. Passage of more gas than usual.  Walking can help get rid of the air that was put into your GI tract during the procedure and reduce the bloating. If you had a lower endoscopy (such as a colonoscopy or flexible sigmoidoscopy) you may notice spotting of blood in your stool or on the toilet paper. If you underwent a bowel prep for your procedure, you may not have a normal bowel movement for a few days.  Please Note:  You might notice some irritation and congestion in your nose or some drainage.  This is from the oxygen used during your procedure.  There is no need for concern and it should clear up in a day or so.  SYMPTOMS TO REPORT IMMEDIATELY:  Following lower endoscopy (colonoscopy or flexible sigmoidoscopy):  Excessive amounts of blood in the stool  Significant tenderness or worsening of abdominal pains  Swelling of the abdomen that is new, acute  Fever of 100F or higher  For urgent or emergent issues, a gastroenterologist can be reached at any hour by calling [phone]. Do not use MyChart messaging for urgent concerns.    DIET:  We do recommend a small meal at first, but then you may proceed to your regular diet.  Drink plenty of  fluids but you should avoid alcoholic beverages for 24 hours.  ACTIVITY:  You should plan to take it easy for the rest of today and you should NOT DRIVE or use heavy machinery until tomorrow (because of the sedation medicines used during the test).    FOLLOW UP: Our staff will call the number listed on your records the next business day following your procedure.  We will call around 7:15- 8:00 am to check on you and address any questions or concerns that you may have regarding the information given to you following your procedure. If we do not reach you, we will leave a message.     If any biopsies were taken you will be contacted by phone or by letter within the next 1-3 weeks.  Please call us at [phone] if you have not heard about the biopsies in 3 weeks.    SIGNATURES/CONFIDENTIALITY: You and/or your care partner have signed paperwork which will be entered into your electronic medical record.  These signatures attest to the fact that that the information above on your After Visit Summary has been reviewed and is understood.  Full responsibility of the confidentiality of this discharge information lies with you and/or your care-partner.

## 2022-07-25 NOTE — Progress Notes (Signed)
Report given to PACU, vss 

## 2022-07-26 ENCOUNTER — Telehealth: Payer: Self-pay

## 2022-07-26 DIAGNOSIS — E119 Type 2 diabetes mellitus without complications: Secondary | ICD-10-CM | POA: Diagnosis not present

## 2022-07-26 LAB — HM DIABETES EYE EXAM

## 2022-07-26 NOTE — Telephone Encounter (Signed)
Left message on follow up call. 

## 2022-08-05 ENCOUNTER — Encounter: Payer: Self-pay | Admitting: Nurse Practitioner

## 2022-09-09 ENCOUNTER — Other Ambulatory Visit (HOSPITAL_COMMUNITY): Payer: Self-pay

## 2022-10-02 ENCOUNTER — Other Ambulatory Visit (HOSPITAL_COMMUNITY): Payer: Self-pay

## 2022-10-28 ENCOUNTER — Other Ambulatory Visit (HOSPITAL_COMMUNITY): Payer: Self-pay

## 2022-11-15 ENCOUNTER — Encounter: Payer: Self-pay | Admitting: Nurse Practitioner

## 2022-11-15 ENCOUNTER — Ambulatory Visit (INDEPENDENT_AMBULATORY_CARE_PROVIDER_SITE_OTHER): Payer: BC Managed Care – PPO | Admitting: Nurse Practitioner

## 2022-11-15 VITALS — BP 106/68 | HR 82 | Temp 97.6°F | Ht 65.0 in | Wt 166.2 lb

## 2022-11-15 DIAGNOSIS — E66812 Obesity, class 2: Secondary | ICD-10-CM

## 2022-11-15 DIAGNOSIS — Z0001 Encounter for general adult medical examination with abnormal findings: Secondary | ICD-10-CM

## 2022-11-15 DIAGNOSIS — Z8742 Personal history of other diseases of the female genital tract: Secondary | ICD-10-CM

## 2022-11-15 DIAGNOSIS — E559 Vitamin D deficiency, unspecified: Secondary | ICD-10-CM

## 2022-11-15 DIAGNOSIS — Z0000 Encounter for general adult medical examination without abnormal findings: Secondary | ICD-10-CM

## 2022-11-15 DIAGNOSIS — Z7985 Long-term (current) use of injectable non-insulin antidiabetic drugs: Secondary | ICD-10-CM

## 2022-11-15 DIAGNOSIS — E1169 Type 2 diabetes mellitus with other specified complication: Secondary | ICD-10-CM

## 2022-11-15 DIAGNOSIS — I10 Essential (primary) hypertension: Secondary | ICD-10-CM | POA: Diagnosis not present

## 2022-11-15 DIAGNOSIS — E785 Hyperlipidemia, unspecified: Secondary | ICD-10-CM

## 2022-11-15 DIAGNOSIS — Z6835 Body mass index (BMI) 35.0-35.9, adult: Secondary | ICD-10-CM

## 2022-11-15 DIAGNOSIS — E1165 Type 2 diabetes mellitus with hyperglycemia: Secondary | ICD-10-CM

## 2022-11-15 DIAGNOSIS — R8290 Unspecified abnormal findings in urine: Secondary | ICD-10-CM

## 2022-11-15 LAB — COMPREHENSIVE METABOLIC PANEL
ALT: 16 U/L (ref 0–35)
AST: 13 U/L (ref 0–37)
Albumin: 4.4 g/dL (ref 3.5–5.2)
Alkaline Phosphatase: 103 U/L (ref 39–117)
BUN: 14 mg/dL (ref 6–23)
CO2: 29 meq/L (ref 19–32)
Calcium: 9.9 mg/dL (ref 8.4–10.5)
Chloride: 106 meq/L (ref 96–112)
Creatinine, Ser: 0.86 mg/dL (ref 0.40–1.20)
GFR: 76.83 mL/min (ref >60.00)
Glucose, Bld: 86 mg/dL (ref 70–99)
Potassium: 4.1 meq/L (ref 3.5–5.1)
Sodium: 142 meq/L (ref 135–145)
Total Bilirubin: 0.5 mg/dL (ref 0.2–1.2)
Total Protein: 7 g/dL (ref 6.0–8.3)

## 2022-11-15 LAB — MICROALBUMIN / CREATININE URINE RATIO
Creatinine,U: 173.5 mg/dL
Microalb Creat Ratio: 0.5 mg/g (ref 0.0–30.0)
Microalb, Ur: 0.8 mg/dL (ref 0.0–1.9)

## 2022-11-15 LAB — HEMOGLOBIN A1C: Hgb A1c MFr Bld: 5.6 % (ref 4.6–6.5)

## 2022-11-15 LAB — VITAMIN D 25 HYDROXY (VIT D DEFICIENCY, FRACTURES): VITD: 33.14 ng/mL (ref 30.00–100.00)

## 2022-11-15 NOTE — Patient Instructions (Addendum)
Go to lab Continue Heart healthy diet and daily exercise. Maintain current medications. 

## 2022-11-15 NOTE — Progress Notes (Unsigned)
Complete physical exam  Patient: Kara Hoover   DOB: Jan 02, 1971   52 y.o. Female  MRN: [id number] Visit Date: 11/18/2022  Subjective:    Chief Complaint  Patient presents with   Annual Exam   Kara Hoover is a 52 y.o. female who presents today for a complete physical exam. She reports consuming a general diet. Home exercise routine includes calisthenics and stretching. Gym/ health club routine includes cardio. She generally feels well. She reports sleeping well. She does not have additional problems to discuss today.  Vision:Yes Dental:Yes STD Screen:No  BP Readings from Last 3 Encounters:  11/15/22 106/68  07/25/22 120/80  07/06/22 (!) 138/93   Wt Readings from Last 3 Encounters:  11/15/22 166 lb 3.2 oz (75.4 kg)  07/25/22 165 lb (74.8 kg)  07/11/22 165 lb (74.8 kg)   Most recent fall risk assessment:    11/15/2022   10:11 AM  Fall Risk   Falls in the past year? 0  Number falls in past yr: 0  Injury with Fall? 0  Risk for fall due to : No Fall Risks  Follow up Falls prevention discussed   Depression screen:Yes - No Depression  Most recent depression screenings:    11/15/2022   10:11 AM 10/05/2021    9:28 AM  PHQ 2/9 Scores  PHQ - 2 Score 0 0  PHQ- 9 Score 0     HPI  Primary hypertension BP at goal with valsartan BP Readings from Last 3 Encounters:  11/15/22 106/68  07/25/22 120/80  07/06/22 (!) 138/93    Maintain med dose Repeat CMP  Type 2 diabetes mellitus with hyperglycemia, without long-term current use of insulin (HCC) Repeat hgbA1c Denies any adverse effects with mounjaro Negative neuropathy or nephropathy or DM retinopathy. Maintain med dose F/up in 6months  Vitamin D deficiency Maintain OVER THE COUNTER dose 2000IU daily Repeat vit. D   Past Medical History:  Diagnosis Date   Diabetes mellitus without complication (HCC)    Hypertension    Past Surgical History:  Procedure Laterality Date   ABDOMINAL HYSTERECTOMY      AUGMENTATION MAMMAPLASTY     TUMOR REMOVAL     Social History   Socioeconomic History   Marital status: Married    Spouse name: Not on file   Number of children: Not on file   Years of education: Not on file   Highest education level: Not on file  Occupational History   Not on file  Tobacco Use   Smoking status: Never   Smokeless tobacco: Never  Vaping Use   Vaping status: Never Used  Substance and Sexual Activity   Alcohol use: Not Currently   Drug use: No   Sexual activity: Yes    Birth control/protection: None, Surgical  Other Topics Concern   Not on file  Social History Narrative   Not on file   Social Determinants of Health   Financial Resource Strain: Not on file  Food Insecurity: Not on file  Transportation Needs: Not on file  Physical Activity: Not on file  Stress: Not on file  Social Connections: Not on file  Intimate Partner Violence: Not on file   Family Status  Relation Name Status   Mother  Alive   Father  Alive   Other  (Not Specified)   Neg Hx  (Not Specified)  No partnership data on file   Family History  Problem Relation Age of Onset   Fibromyalgia Mother    Hypertension Father  Stroke Father    Cancer Other    Colon cancer Neg Hx    Colon polyps Neg Hx    Esophageal cancer Neg Hx    Rectal cancer Neg Hx    Stomach cancer Neg Hx    No Known Allergies  Patient Care Team: Shiquita Collignon, Bonna Gains, NP as PCP - General (Internal Medicine) Gynecology, [facility] (Obstetrics and Gynecology) [facility])   Medications: Outpatient Medications Prior to Visit  Medication Sig   atorvastatin (LIPITOR) 10 MG tablet Take 1 tablet (10 mg total) by mouth at bedtime.   blood glucose meter kit and supplies KIT Dispense based on patient and insurance preference. Check glucose daily before breakfast. E11.65   glucose blood (ACCU-CHEK GUIDE) test strip 1 each by Other route daily at 12 noon. Use as instructed   metFORMIN  (GLUCOPHAGE) 500 MG tablet Take 1 tablet (500 mg total) by mouth 2 (two) times daily with a meal.   tirzepatide (MOUNJARO) 2.5 MG/0.5ML Pen Inject 2.5 mg into the skin once a week.   valsartan (DIOVAN) 80 MG tablet Take 1 tablet (80 mg total) by mouth daily.   VITAMIN D PO Take 2,000 Int'l Units/day by mouth daily at 12 noon. OTC   No facility-administered medications prior to visit.    Review of Systems  Constitutional:  Negative for activity change, appetite change and unexpected weight change.  Respiratory: Negative.    Cardiovascular: Negative.   Gastrointestinal: Negative.   Endocrine: Negative for cold intolerance and heat intolerance.  Genitourinary: Negative.   Musculoskeletal: Negative.   Skin: Negative.   Neurological: Negative.   Hematological: Negative.   Psychiatric/Behavioral:  Negative for behavioral problems, decreased concentration, dysphoric mood, hallucinations, self-injury, sleep disturbance and suicidal ideas. The patient is not nervous/anxious.         Objective:  BP 106/68   Pulse 82   Temp 97.6 F (36.4 C) (Temporal)   Ht 5\' 5"  (1.651 m)   Wt 166 lb 3.2 oz (75.4 kg)   SpO2 98%   BMI 27.66 kg/m     Physical Exam Vitals and nursing note reviewed.  Constitutional:      General: She is not in acute distress. HENT:     Right Ear: Tympanic membrane, ear canal and external ear normal.     Left Ear: Tympanic membrane, ear canal and external ear normal.     Nose: Nose normal.  Eyes:     Extraocular Movements: Extraocular movements intact.     Conjunctiva/sclera: Conjunctivae normal.     Pupils: Pupils are equal, round, and reactive to light.  Neck:     Thyroid: No thyroid mass, thyromegaly or thyroid tenderness.  Cardiovascular:     Rate and Rhythm: Normal rate and regular rhythm.     Pulses: Normal pulses.     Heart sounds: Normal heart sounds.  Pulmonary:     Effort: Pulmonary effort is normal.     Breath sounds: Normal breath sounds.   Abdominal:     General: Bowel sounds are normal.     Palpations: Abdomen is soft.  Musculoskeletal:        General: Normal range of motion.     Cervical back: Normal range of motion and neck supple.     Right lower leg: No edema.     Left lower leg: No edema.  Lymphadenopathy:     Cervical: No cervical adenopathy.  Skin:    General: Skin is warm and dry.  Neurological:  Mental Status: She is alert and oriented to person, place, and time.     Cranial Nerves: No cranial nerve deficit.  Psychiatric:        Mood and Affect: Mood normal.        Behavior: Behavior normal.        Thought Content: Thought content normal.      Results for orders placed or performed in visit on 11/15/22  Microalbumin / creatinine urine ratio  Result Value Ref Range   Microalb, Ur 0.8 0.0 - 1.9 mg/dL   Creatinine,U 782.9 mg/dL   Microalb Creat Ratio 0.5 0.0 - 30.0 mg/g  Hemoglobin A1c  Result Value Ref Range   Hgb A1c MFr Bld 5.6 4.6 - 6.5 %  Comprehensive metabolic panel  Result Value Ref Range   Sodium 142 135 - 145 mEq/L   Potassium 4.1 3.5 - 5.1 mEq/L   Chloride 106 96 - 112 mEq/L   CO2 29 19 - 32 mEq/L   Glucose, Bld 86 70 - 99 mg/dL   BUN 14 6 - 23 mg/dL   Creatinine, Ser 5.62 0.40 - 1.20 mg/dL   Total Bilirubin 0.5 0.2 - 1.2 mg/dL   Alkaline Phosphatase 103 39 - 117 U/L   AST 13 0 - 37 U/L   ALT 16 0 - 35 U/L   Total Protein 7.0 6.0 - 8.3 g/dL   Albumin 4.4 3.5 - 5.2 g/dL   GFR 13.08 >65.78 mL/min   Calcium 9.9 8.4 - 10.5 mg/dL  VITAMIN D 25 Hydroxy (Vit-D Deficiency, Fractures)  Result Value Ref Range   VITD 33.14 30.00 - 100.00 ng/mL      Assessment & Plan:    Routine Health Maintenance and Physical Exam  Immunization History  Administered Date(s) Administered   Tdap 12/25/2013, 02/25/2019    Health Maintenance  Topic Date Due   Cervical Cancer Screening (HPV/Pap Cotest)  11/29/2019   COVID-19 Vaccine (1 - 2023-24 season) 12/01/2022 (Originally 09/29/2022)    Hepatitis C Screening  02/09/2023 (Originally 11/25/1986)   HIV Screening  02/09/2023 (Originally 11/25/1983)   Zoster Vaccines- Shingrix (1 of 2) 02/15/2023 (Originally 11/25/2018)   INFLUENZA VACCINE  04/28/2023 (Originally 08/29/2022)   HEMOGLOBIN A1C  05/16/2023   OPHTHALMOLOGY EXAM  07/26/2023   Diabetic kidney evaluation - eGFR measurement  11/15/2023   Diabetic kidney evaluation - Urine ACR  11/15/2023   FOOT EXAM  11/15/2023   MAMMOGRAM  12/29/2023   DTaP/Tdap/Td (3 - Td or Tdap) 02/24/2029   Colonoscopy  07/24/2032   HPV VACCINES  Aged Out   Discussed health benefits of physical activity, and encouraged her to engage in regular exercise appropriate for her age and condition.  Problem List Items Addressed This Visit     History of abnormal cervical Papanicolaou smear   Primary hypertension    BP at goal with valsartan BP Readings from Last 3 Encounters:  11/15/22 106/68  07/25/22 120/80  07/06/22 (!) 138/93    Maintain med dose Repeat CMP      Type 2 diabetes mellitus with hyperglycemia, without long-term current use of insulin (HCC)    Repeat hgbA1c Denies any adverse effects with mounjaro Negative neuropathy or nephropathy or DM retinopathy. Maintain med dose F/up in 6months      Relevant Orders   Microalbumin / creatinine urine ratio (Completed)   Hemoglobin A1c (Completed)   Vitamin D deficiency    Maintain OVER THE COUNTER dose 2000IU daily Repeat vit. D      Relevant  Orders   VITAMIN D 25 Hydroxy (Vit-D Deficiency, Fractures) (Completed)   Other Visit Diagnoses     Encounter for preventative adult health care exam with abnormal findings    -  Primary   Relevant Orders   Comprehensive metabolic panel (Completed)   Abnormal urine odor       Relevant Orders   Urinalysis w microscopic + reflex cultur      Return in about 6 months (around 05/16/2023) for HTN, DM, hyperlipidemia (fasting).     Alysia Penna, NP

## 2022-11-18 MED ORDER — TIRZEPATIDE 2.5 MG/0.5ML ~~LOC~~ SOAJ: 2.5000 mg | SUBCUTANEOUS | 1 refills | Status: DC

## 2022-11-18 NOTE — Assessment & Plan Note (Signed)
Repeat hgbA1c Denies any adverse effects with mounjaro Negative neuropathy or nephropathy or DM retinopathy. Maintain med dose F/up in 6months

## 2022-11-18 NOTE — Assessment & Plan Note (Signed)
BP at goal with valsartan BP Readings from Last 3 Encounters:  11/15/22 106/68  07/25/22 120/80  07/06/22 (!) 138/93    Maintain med dose Repeat CMP

## 2022-11-18 NOTE — Assessment & Plan Note (Signed)
Maintain OVER THE COUNTER dose 2000IU daily Repeat vit. D

## 2022-11-19 ENCOUNTER — Other Ambulatory Visit: Payer: Self-pay | Admitting: Nurse Practitioner

## 2022-11-19 DIAGNOSIS — R8290 Unspecified abnormal findings in urine: Secondary | ICD-10-CM

## 2022-11-19 LAB — URINALYSIS W MICROSCOPIC + REFLEX CULTURE

## 2022-11-22 ENCOUNTER — Other Ambulatory Visit: Payer: BC Managed Care – PPO

## 2022-11-22 DIAGNOSIS — N3000 Acute cystitis without hematuria: Secondary | ICD-10-CM

## 2022-11-22 DIAGNOSIS — R8290 Unspecified abnormal findings in urine: Secondary | ICD-10-CM

## 2022-11-26 LAB — URINALYSIS W MICROSCOPIC + REFLEX CULTURE
Bilirubin Urine: NEGATIVE
Glucose, UA: NEGATIVE
Hgb urine dipstick: NEGATIVE
Hyaline Cast: NONE SEEN /LPF
Ketones, ur: NEGATIVE
Nitrites, Initial: NEGATIVE
Protein, ur: NEGATIVE
RBC / HPF: NONE SEEN /[HPF] (ref 0–2)
Specific Gravity, Urine: 1.011 (ref 1.001–1.035)
pH: 5.5 (ref 5.0–8.0)

## 2022-11-26 LAB — URINE CULTURE
MICRO NUMBER:: 15648149
SPECIMEN QUALITY:: ADEQUATE

## 2022-11-26 LAB — CULTURE INDICATED

## 2022-11-26 MED ORDER — NITROFURANTOIN MONOHYD MACRO 100 MG PO CAPS: 100.0000 mg | ORAL_CAPSULE | Freq: Two times a day (BID) | ORAL | 0 refills | Status: DC

## 2022-11-26 NOTE — Addendum Note (Signed)
Addended by: Alysia Penna L on: 11/26/2022 10:17 AM   Modules accepted: Orders

## 2022-12-20 ENCOUNTER — Other Ambulatory Visit: Payer: Self-pay | Admitting: Nurse Practitioner

## 2022-12-20 DIAGNOSIS — Z1231 Encounter for screening mammogram for malignant neoplasm of breast: Secondary | ICD-10-CM

## 2022-12-24 ENCOUNTER — Other Ambulatory Visit: Payer: Self-pay | Admitting: Nurse Practitioner

## 2022-12-24 ENCOUNTER — Other Ambulatory Visit (HOSPITAL_COMMUNITY): Payer: Self-pay

## 2022-12-24 ENCOUNTER — Other Ambulatory Visit: Payer: Self-pay

## 2022-12-24 DIAGNOSIS — E66812 Obesity, class 2: Secondary | ICD-10-CM

## 2022-12-24 DIAGNOSIS — E1169 Type 2 diabetes mellitus with other specified complication: Secondary | ICD-10-CM

## 2022-12-24 DIAGNOSIS — E1165 Type 2 diabetes mellitus with hyperglycemia: Secondary | ICD-10-CM

## 2022-12-24 DIAGNOSIS — I10 Essential (primary) hypertension: Secondary | ICD-10-CM

## 2023-01-20 ENCOUNTER — Ambulatory Visit (INDEPENDENT_AMBULATORY_CARE_PROVIDER_SITE_OTHER): Payer: BC Managed Care – PPO | Admitting: Family Medicine

## 2023-01-20 ENCOUNTER — Encounter: Payer: Self-pay | Admitting: Family Medicine

## 2023-01-20 VITALS — BP 130/70 | HR 78 | Ht 65.0 in | Wt 164.4 lb

## 2023-01-20 DIAGNOSIS — Z0001 Encounter for general adult medical examination with abnormal findings: Secondary | ICD-10-CM

## 2023-01-20 NOTE — Progress Notes (Unsigned)
    CHIEF COMPLAINT / HPI:  1. Establish care  2. Discussion of stress and anxiety related to work, health of her parents, financial stresses. 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 19 years with company. 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  3. Dx with type 2 DM this year. Quickly changed diet and is exercising much more regulalry, at least 3 times per week.  4. HTN takes meds regularly    PERTINENT  PMH / PSH: I have reviewed the patient's medications, allergies, past medical and surgical history, smoking status and updated in the EMR as appropriate. Hx anxiety, DM, HTN, Hyperlipidemia  OBJECTIVE:  BP 130/84   Pulse 78   Ht 5\' 5"  (1.651 m)   Wt 164 lb 6.4 oz (74.6 kg)   SpO2 100%   BMI 27.36 kg/m   Vital signs reviewed. GENERAL: Well-developed, well-nourished, no acute distress. CARDIOVASCULAR: Regular rate and rhythm no murmur gallop or rub LUNGS: Clear to auscultation bilaterally, no rales or wheeze. ABDOMEN: Soft positive bowel sounds NEURO: No gross focal neurological deficits. MSK: Movement of extremity x 4. PSYCH: AxOx4. Good eye contact.. No psychomotor retardation or agitation. Appropriate speech fluency and content. Asks and answers questions appropriately. Mood is congruent.  ASSESSMENT / PLAN:   Encounter for well adult exam with abnormal findings UTD on health maintenance (scheduled mammogram next month) Has Bilateral patellofemoral syndrome HTN, hyperlipidemia,DM, increased stress and anxiety related to work schedule  Reviewed labs in chart F/u 3 m No med changes at this time Denny Levy MD

## 2023-01-21 DIAGNOSIS — Z0001 Encounter for general adult medical examination with abnormal findings: Secondary | ICD-10-CM | POA: Insufficient documentation

## 2023-01-21 NOTE — Assessment & Plan Note (Signed)
UTD on health maintenance (scheduled mammogram next month) Has Bilateral patellofemoral syndrome HTN, hyperlipidemia,DM, increased stress and anxiety related to work schedule

## 2023-01-23 ENCOUNTER — Encounter: Payer: Self-pay | Admitting: Family Medicine

## 2023-01-24 ENCOUNTER — Ambulatory Visit
Admission: RE | Admit: 2023-01-24 | Discharge: 2023-01-24 | Disposition: A | Discharge status: Home / Self Care | Payer: BC Managed Care – PPO | Source: Ambulatory Visit | Attending: Nurse Practitioner

## 2023-01-24 DIAGNOSIS — Z1231 Encounter for screening mammogram for malignant neoplasm of breast: Secondary | ICD-10-CM | POA: Diagnosis not present

## 2023-02-14 ENCOUNTER — Other Ambulatory Visit: Payer: Self-pay | Admitting: Nurse Practitioner

## 2023-02-14 DIAGNOSIS — E1169 Type 2 diabetes mellitus with other specified complication: Secondary | ICD-10-CM

## 2023-02-19 ENCOUNTER — Other Ambulatory Visit: Payer: Self-pay | Admitting: Nurse Practitioner

## 2023-02-19 ENCOUNTER — Encounter: Payer: Self-pay | Admitting: Nurse Practitioner

## 2023-02-19 DIAGNOSIS — I10 Essential (primary) hypertension: Secondary | ICD-10-CM

## 2023-02-20 ENCOUNTER — Other Ambulatory Visit: Payer: Self-pay

## 2023-02-20 DIAGNOSIS — I10 Essential (primary) hypertension: Secondary | ICD-10-CM

## 2023-02-20 MED ORDER — VALSARTAN 80 MG PO TABS: 80.0000 mg | ORAL_TABLET | Freq: Every day | ORAL | 3 refills | Status: DC

## 2023-02-27 DIAGNOSIS — Z01419 Encounter for gynecological examination (general) (routine) without abnormal findings: Secondary | ICD-10-CM | POA: Diagnosis not present

## 2023-03-24 ENCOUNTER — Encounter: Payer: Self-pay | Admitting: Nurse Practitioner

## 2023-04-01 ENCOUNTER — Encounter: Payer: Self-pay | Admitting: Nurse Practitioner

## 2023-04-01 ENCOUNTER — Ambulatory Visit (INDEPENDENT_AMBULATORY_CARE_PROVIDER_SITE_OTHER): Admitting: Nurse Practitioner

## 2023-04-01 VITALS — BP 122/76 | HR 72 | Temp 97.7°F | Ht 65.0 in | Wt 157.8 lb

## 2023-04-01 DIAGNOSIS — K121 Other forms of stomatitis: Secondary | ICD-10-CM | POA: Diagnosis not present

## 2023-04-01 NOTE — Progress Notes (Signed)
   Acute Office Visit  Subjective:    Patient ID: Kara Hoover, female    DOB: 1970-08-19, 53 y.o.   MRN: [id number]  Chief Complaint  Patient presents with   Mouth Lesions    Pt is in office today with cc of a cold sore on the right side of her mouth x 2 weeks. Pt is currently using OTC Abreva.    Mouth Lesions  Associated symptoms include mouth sores.   Patient is in today for  lower lip ulcer x 1week, onset after URI. Denies any previous oral ulcers, no Hx of HSV. Use of abreeva OVER THE COUNTER with improvement. Denies any sore throat or swollen lymph node.  Outpatient Medications Prior to Visit  Medication Sig   atorvastatin (LIPITOR) 10 MG tablet TAKE 1 TABLET BY MOUTH EVERYDAY AT BEDTIME   blood glucose meter kit and supplies KIT Dispense based on patient and insurance preference. Check glucose daily before breakfast. E11.65   glucose blood (ACCU-CHEK GUIDE) test strip 1 each by Other route daily at 12 noon. Use as instructed   metFORMIN (GLUCOPHAGE) 500 MG tablet Take 1 tablet (500 mg total) by mouth 2 (two) times daily with a meal.   tirzepatide (MOUNJARO) 2.5 MG/0.5ML Pen Inject 2.5 mg into the skin once a week.   valsartan (DIOVAN) 80 MG tablet Take 1 tablet (80 mg total) by mouth daily.   VITAMIN D PO Take 2,000 Int'l Units/day by mouth daily at 12 noon. OTC   nitrofurantoin, macrocrystal-monohydrate, (MACROBID) 100 MG capsule Take 1 capsule (100 mg total) by mouth 2 (two) times daily. (Patient not taking: Reported on 04/01/2023)   No facility-administered medications prior to visit.    Reviewed past medical and social history.  Review of Systems  HENT:  Positive for mouth sores.    Per HPI     Objective:    Physical Exam HENT:     Head: Normocephalic.     Mouth/Throat:     Tongue: No lesions.     Palate: No mass and lesions.     Pharynx: Oropharynx is clear. Uvula midline.      Comments: Healed ulcer on right lower lip border. No erythema or  induration Lymphadenopathy:     Cervical: No cervical adenopathy.    BP 122/76   Pulse 72   Temp 97.7 F (36.5 C)   Ht 5\' 5"  (1.651 m)   Wt 157 lb 12.8 oz (71.6 kg)   SpO2 99%   BMI 26.26 kg/m    No results found for any visits on 04/01/23.     Assessment & Plan:   Problem List Items Addressed This Visit   None Visit Diagnoses       Oral ulcer    -  Primary     Aphthous ulcer vs herpes labialis? Provided education about the difference. Healed ulcer, so unable to swab at this time. No treatment of testing needed at this time.  No orders of the defined types were placed in this encounter.  Return if symptoms worsen or fail to improve.   Alysia Penna, NP

## 2023-04-01 NOTE — Patient Instructions (Signed)
 Oral Ulcers Oral ulcers are small sores inside the mouth or near the mouth. They may occur on or inside the lips, inside the cheeks, on the tongue, or anywhere else inside or near the mouth. They may be called canker sores or cold sores, which are two types of oral ulcers. Many oral ulcers are harmless and go away on their own. In some cases, oral ulcers may require medical care to determine the cause and proper treatment. What are the causes? Common causes of this condition include: Infections caused by viruses, bacteria, or fungi. Emotional stress. Foods or chemicals that irritate the mouth. Injury or physical irritation of the mouth. Medicines. Allergies. Tobacco use. Less common causes include: Skin disease. A type of herpes virus infection (herpes simplex or herpes zoster). Oral cancer. In some cases, the cause may not be known. What increases the risk? You are more likely to develop this condition if: You wear dental braces, dentures, or retainers. You do not take good care of your mouth and teeth (poor oral hygiene). You have sensitive skin. You have a condition that affects the entire body (systemic condition), such as an immune disorder. What are the signs or symptoms? The main symptom of this condition is having one or more oval-shaped or round ulcers that have red borders. Symptoms may vary depending on the cause. This includes: Location of the ulcers. Ulcers may be found inside the mouth, on the gums, or on the insides of the lips or cheeks. They may also be found on the lips or on skin that is near the mouth, such as the cheeks or chin. Pain. Ulcers can be painful and uncomfortable, or they can be painless. Appearance of the ulcers. They may look like red blisters and be filled with fluid, or they may be white or yellow patches. Frequency of outbreaks. Ulcers may go away permanently after one outbreak, or they may come back (recur) often or rarely. How is this  diagnosed? This condition is diagnosed with a physical exam. Your health care provider may ask you questions about your lifestyle and your medical history. You may have tests, including: Blood tests. Removal of a small number of cells from an ulcer to be examined under a microscope (biopsy). How is this treated? Treatment depends on the severity and cause of the condition. Oral ulcers often go away on their own in 1-2 weeks. Treatment may include medicines, such as: Medicines to treat a viral infection (antivirals), a bacterial infection (antibiotics), or a fungal infection (antifungals). Medicines to help control pain. This may include: Over-the-counter pain medicines. Gel, cream, or spray to numb the area (topical anesthetic) if you have severe pain. Other medicines to coat or numb your mouth. Follow these instructions at home: Medicines Take or use over-the-counter and prescription medicines only as told by your health care provider. If you were prescribed an antibiotic medicine, take it as told by your health care provider. Do not stop taking the antibiotic even if you start to feel better. Do not use products that contain benzocaine (including numbing gels) to treat teething or mouth pain in children who are younger than 2 years. These products may cause a rare but serious blood condition. Eating and drinking Eat a balanced diet. Do not eat: Spicy foods. Citrus, such as oranges. Other foods and drinks that you think may cause or irritate your ulcers. Drink enough fluid to keep your urine pale yellow. Avoid drinking alcohol. Lifestyle  Practice good oral hygiene: Gently brush your teeth  with a soft toothbrush two times a day. Floss your teeth every day. Get regular dental cleanings and checkups. Do not use any products that contain nicotine or tobacco. These products include cigarettes, chewing tobacco, and vaping devices, such as e-cigarettes. If you need help quitting, ask your  health care provider. Managing pain If directed, put ice on your face in the affected area to help reduce pain. To do this: Put ice in a plastic bag. Place a towel between your skin and the bag. Leave the ice on for 20 minutes, 2-3 times a day. Remove the ice if your skin turns bright red. This is very important. If you cannot feel pain, heat, or cold, you have a greater risk of damage to the area. Avoid physical or chemical irritants that may have caused the ulcers or made them worse, such as mouthwashes that contain alcohol (ethanol). If you wear dental braces, dentures, or retainers, work with your health care provider to make sure these devices are fitted correctly. If you were prescribed a prescription mouthwash to help reduce pain in your mouth, use it as told by your health care provider. General instructions Rinse your mouth with a mixture of salt and water 3-4 times a day or as told by your health care provider. To make salt water, completely dissolve -1 tsp (3-6 g) of salt in 1 cup (237 mL) of warm water. Keep all follow-up visits. This is important. Contact a health care provider if: You have: Pain that gets worse or does not get better with medicine. Four or more ulcers at one time. A fever. New ulcers that look or feel different from other ulcers you have. Inflammation in one eye or both eyes. Ulcers that do not go away after 10 days. You develop new symptoms in your mouth, such as: Bleeding or crusting around your lips or gums. Tooth pain. Difficulty swallowing. You develop symptoms on your skin or genitals, such as: A rash or blisters. Burning or itching sensations. Your ulcers begin or get worse after you start a new medicine. Get help right away if: You have difficulty breathing. You have swelling in your face or neck. You have excessive bleeding from your mouth. You have severe pain. Summary Oral ulcers may occur anywhere inside or near the mouth. They can be  caused by many things, such as infections, stress, injury or irritation, or tobacco use. Oral ulcers can be painful or painless. Treatment may include medicines to relieve pain or to treat an infection (if appropriate). Most oral ulcers go away in 1-2 weeks. This information is not intended to replace advice given to you by your health care provider. Make sure you discuss any questions you have with your health care provider. Document Revised: 10/25/2020 Document Reviewed: 10/25/2020 Elsevier Patient Education  2024 [employer].

## 2023-04-10 ENCOUNTER — Ambulatory Visit: Payer: BC Managed Care – PPO | Admitting: Nurse Practitioner

## 2023-05-07 ENCOUNTER — Encounter: Payer: Self-pay | Admitting: Nurse Practitioner

## 2023-05-07 DIAGNOSIS — E1169 Type 2 diabetes mellitus with other specified complication: Secondary | ICD-10-CM

## 2023-05-07 DIAGNOSIS — I10 Essential (primary) hypertension: Secondary | ICD-10-CM

## 2023-05-07 DIAGNOSIS — E66812 Obesity, class 2: Secondary | ICD-10-CM

## 2023-05-07 DIAGNOSIS — E1165 Type 2 diabetes mellitus with hyperglycemia: Secondary | ICD-10-CM

## 2023-05-09 NOTE — Telephone Encounter (Signed)
 Patient is needing a call back regarding a call from the office

## 2023-05-10 MED ORDER — TIRZEPATIDE 2.5 MG/0.5ML ~~LOC~~ SOAJ: 2.5000 mg | SUBCUTANEOUS | 1 refills | Status: DC

## 2023-05-15 ENCOUNTER — Ambulatory Visit: Payer: BC Managed Care – PPO | Admitting: Nurse Practitioner

## 2023-05-16 ENCOUNTER — Other Ambulatory Visit: Payer: Self-pay | Admitting: Nurse Practitioner

## 2023-05-16 DIAGNOSIS — E1165 Type 2 diabetes mellitus with hyperglycemia: Secondary | ICD-10-CM

## 2023-05-20 ENCOUNTER — Other Ambulatory Visit: Payer: Self-pay

## 2023-05-20 DIAGNOSIS — E1165 Type 2 diabetes mellitus with hyperglycemia: Secondary | ICD-10-CM

## 2023-05-20 MED ORDER — METFORMIN HCL 500 MG PO TABS
500.0000 mg | ORAL_TABLET | Freq: Two times a day (BID) | ORAL | 3 refills | Status: AC
Start: 2023-05-20 — End: ?

## 2023-05-26 ENCOUNTER — Ambulatory Visit: Admitting: Family Medicine

## 2023-05-26 NOTE — Progress Notes (Deleted)
   SUBJECTIVE:   CHIEF COMPLAINT / HPI:   Anxiety - Medications: *** - Taking: *** - Counseling: *** - Previous hospitalizations: *** - FH of psych illness: *** - Symptoms: *** - Current stressors: *** - Coping Mechanisms: ***   OBJECTIVE:   There were no vitals taken for this visit.  ***  ASSESSMENT/PLAN:   No problem-specific Assessment & Plan notes found for this encounter.     Kandis Ormond, DO

## 2023-05-27 ENCOUNTER — Encounter: Payer: Self-pay | Admitting: Family Medicine

## 2023-05-27 ENCOUNTER — Telehealth: Admitting: Family Medicine

## 2023-05-27 DIAGNOSIS — F4323 Adjustment disorder with mixed anxiety and depressed mood: Secondary | ICD-10-CM

## 2023-05-27 MED ORDER — FLUOXETINE HCL 20 MG PO TABS: 20.0000 mg | ORAL_TABLET | Freq: Every day | ORAL | 3 refills | Status: DC

## 2023-05-27 NOTE — Progress Notes (Signed)
 [facility] Telemedicine Visit  Patient consented to have virtual visit and was identified by name and date of birth. Method of visit: Video  Encounter participants: Patient: Kara Hoover - located at home Provider: Violetta Grice - located at office Others (if applicable): n/a  Chief Complaint: stress, difficulty focusing, anxiety and restlessness, poor sleep.   HPI:  Having stress related to financial issues.  Has been has been out of work for medical reasons.  They are both trying to help her parents who are in financial difficulties.  At this point she feels like she is pretty much at the end of her ability to function.  Needs a couple of days to count to pull her self together.  Difficulty focusing, tearfulness, anxiety and restlessness, poor sleep.  No suicidal or homicidal ideation.  Not really irritable.  Sad.  ROS: per HPI  Pertinent PMHx: Never been on antidepressant or antianxiety medicine before  Exam:  There were no vitals taken for this visit.  Respiratory: Nonlabored PSYCH: She is tearful.  Good eye contact.  Asks and answers questions appropriately.  Denies any hallucinations.  Speech is normal and cadence and content.  No psychomotor retardation.  No agitation.  Assessment/Plan:  No problem-specific Assessment & Plan notes found for this encounter. Adjustment disorder not otherwise specified: I think she is just overwhelmed.  I think she would benefit from taking a few days off from work and she will send me some FMLA paperwork.  We can start her FMLA today and go through 9 May and then see how she is doing.  Will start her on fluoxetine 20 mg daily.  She will call me in the interim if there is new or worsening symptoms.  I will arrange visit with her in approximately 2-1/2 weeks.  Time spent during visit with patient: 26 minutes

## 2023-05-28 ENCOUNTER — Telehealth: Payer: Self-pay | Admitting: Family Medicine

## 2023-05-28 NOTE — Telephone Encounter (Signed)
 Patient dropped off FMLA paperwork to be completed. Last DOS was 05/27/23. Placed in [employer].

## 2023-05-28 NOTE — Telephone Encounter (Signed)
 Reviewed form and placed in PCP's box for completion.  Glennie Hawk, CMA

## 2023-06-02 NOTE — Telephone Encounter (Signed)
 Form placed up front for pick up.   Copy made for batch scanning.   Copy placed in fax pile.   Attempted to call patient to make aware, however no answer.

## 2023-06-05 ENCOUNTER — Encounter: Payer: Self-pay | Admitting: Family Medicine

## 2023-06-05 ENCOUNTER — Telehealth: Admitting: Family Medicine

## 2023-06-05 DIAGNOSIS — F4323 Adjustment disorder with mixed anxiety and depressed mood: Secondary | ICD-10-CM

## 2023-06-05 MED ORDER — FLUOXETINE HCL 20 MG PO TABS: ORAL_TABLET | ORAL | Status: DC

## 2023-06-05 NOTE — Progress Notes (Addendum)
 [facility] Telemedicine Visit  Patient consented to have virtual visit and was identified by name and date of birth. Method of visit: Video  Encounter participants: Patient: Kara Hoover - located at in her car Provider: Violetta Grice - located at office Others (if applicable): n/a  Chief Complaint: Continued anxiety, tearfulness, stress.  Has been on medication and it seems to be helping but she still having tremendous difficulty focusing.  Lots of things going on in the family and she is the only one that currently is able to handle them.  Husband is recovering from surgery.  Her parents are financially strapped.  Her daughter is in her third year of college and is needing some finds. Had to give up her dog a while back because of the apartment.  Dog was one of the few things that helped to keep her calm.    ROS: per HPI  Pertinent PMHx: Currently on FMLA  Exam:  There were no vitals taken for this visit.  Respiratory: Normal PSYCH: Cheerful at times.  Speaks in normal sentences and asked and answers questions appropriately.  No hallucinations.  Speech is not pressured.  Assessment/Plan:  Adjustment disorder with mixed anxiety and depressed mood I suspect the medication has helped and she is definitely somewhat improved but unfortunately her stressors have also increased.  I think will be beneficial for her to get another dog as this was quite a comfort for her especially in the evenings when she is having a lot of anxiety.  She also works from home.  I will give her a letter for emotional support animal.  Increase her fluoxetine  to 40 mg daily.  Will touch base in the next 4 to 5 days via phone/MyChart.  Will extend her leave through May.    Time spent during visit with patient: 35 minutes

## 2023-06-05 NOTE — Assessment & Plan Note (Signed)
 I suspect the medication has helped and she is definitely somewhat improved but unfortunately her stressors have also increased.  I think will be beneficial for her to get another dog as this was quite a comfort for her especially in the evenings when she is having a lot of anxiety.  She also works from home.  I will give her a letter for emotional support animal.  Increase her fluoxetine  to 40 mg daily.  Will touch base in the next 4 to 5 days via phone/MyChart.  Will extend her leave through May.

## 2023-06-06 ENCOUNTER — Telehealth: Payer: Self-pay

## 2023-06-06 ENCOUNTER — Telehealth: Payer: Self-pay | Admitting: Family Medicine

## 2023-06-06 NOTE — Telephone Encounter (Signed)
 Reviewed FMLA form.  Placed in PCP's box to be completed.  Christ Courier, CMA

## 2023-06-06 NOTE — Telephone Encounter (Signed)
 Patient dropped of another set of FMLA paperwork said the date is needing to be update which she said doctor would have all information. Placing forms in the [place] team folder.   Thanks!

## 2023-06-16 ENCOUNTER — Telehealth: Payer: Self-pay | Admitting: Family Medicine

## 2023-06-16 NOTE — Telephone Encounter (Signed)
  Reviewed and  Placed form in PCP's box for completion.  Gerold Kos, CMA

## 2023-06-18 NOTE — Telephone Encounter (Signed)
 Copy made for batch scanning.   Copy placed in fax pile.

## 2023-06-20 ENCOUNTER — Telehealth (INDEPENDENT_AMBULATORY_CARE_PROVIDER_SITE_OTHER): Payer: Self-pay | Admitting: Family Medicine

## 2023-06-20 DIAGNOSIS — F4323 Adjustment disorder with mixed anxiety and depressed mood: Secondary | ICD-10-CM

## 2023-06-20 NOTE — Telephone Encounter (Signed)
 [facility] Telemedicine Visit  Patient consented to have virtual visit and was identified by name and date of birth. Method of visit: Telephone  Encounter participants: Patient: Kara Hoover - located at home Provider: Violetta Grice - located at office Others (if applicable): n/a  Chief Complaint: Follow-up adjustment disorder  HPI:  Feeling just a tiny bit better.  Able to do little bit better job of focusing.  Still has a lot of tearfulness but not quite as bad, maybe 50% better.  Heard back from her insurance company and they denied her short-term disability.  That is really set her back.  She brings me some new paperwork today and dropped it off at the office to fill out.  Regarding returning to work, she would really like to get back as she has significant financial stressors at home.  Her work is 10-hour shifts which is quite grueling, especially the sitting for that long which causes her to have joint pains in her knees and low back.  Job is also fairly stressful and requires a lot of focus  She has had a few panic attacks in the last week or so especially when she heard back from [employer].  She was starting to feel little bit more hopeful about coming out on the other side of this bad spell but now she is not so sure.  She does not want to lose her job.  Her husband is recovering from surgery and that was 1 reason she was kind of hopeful to get things back on track.  She wonders if there was some discrepancy because she reports her first day of absence was 423 and evidently on the paperwork I filled out her first day of absence was 429.  429 was the date which she came to see me.  We had increased her fluoxetine  and that seems to have helped just a little bit with sleep.  She is able to think a little more clearly but still not back to baseline.  ROS: per HPI  Pertinent PMHx: No prior history of adjustment disorder or major depressive  episodes.  Exam:  There were no vitals taken for this visit.  Respiratory: No increased work of breathing.  She gets little panicky sometimes during our conversation today during that time she does paint a little bit but I think it is related to mental stress rather than pathologic respiratory problem. PSYCH: She is alert and oriented x 4.  She does have some pressured speech.  At times her thought processes are a little bit tangential and she is intermittently tearful.  Mood lability overall however is better.  She still seems to have some difficulty focusing and says she thinks this had gotten better until her insurance was denied.  Assessment/Plan:  Adjustment disorder with mixed anxiety and depressed mood Overall she has had probably about 40 to 50% improvement.  She still having intermittent panic and anxiety, difficulty focusing, tangential thoughts.  This is improved on the 40 mg dose of SSRI.  We discussed cognitive behavioral therapy referral and she is open to that.  Right now one of the issues will be affording that.  Will continue 40 mg fluoxetine  and I will speak with her early next week.  I will be happy to fill out her paperwork and send her a copy.    Time spent during visit with patient: 30 minutes

## 2023-06-20 NOTE — Telephone Encounter (Signed)
 Phone call 06/19/2023 Some improvement in hope. <Maybe a less tearfulness. Anxiety still present but improved.  Increased dosage of SSRI tolerated. Recommend CBT.WIll fill out additional paperwork if needed.

## 2023-06-20 NOTE — Assessment & Plan Note (Signed)
 Overall she has had probably about 40 to 50% improvement.  She still having intermittent panic and anxiety, difficulty focusing, tangential thoughts.  This is improved on the 40 mg dose of SSRI.  We discussed cognitive behavioral therapy referral and she is open to that.  Right now one of the issues will be affording that.  Will continue 40 mg fluoxetine  and I will speak with her early next week.  I will be happy to fill out her paperwork and send her a copy.

## 2023-07-02 ENCOUNTER — Encounter: Payer: Self-pay | Admitting: Family Medicine

## 2023-07-07 ENCOUNTER — Other Ambulatory Visit: Payer: Self-pay | Admitting: Family Medicine

## 2023-07-07 MED ORDER — TIRZEPATIDE-WEIGHT MANAGEMENT 5 MG/0.5ML ~~LOC~~ SOLN: 5.0000 mg | SUBCUTANEOUS | 2 refills | Status: DC

## 2023-07-08 ENCOUNTER — Telehealth: Payer: Self-pay

## 2023-07-08 NOTE — Telephone Encounter (Signed)
 Rec'd PA request for Zepbound  vials.   Could RX be resent for brand Mounjaro ? Will initiate PA in meantime. Thanks!

## 2023-07-09 MED ORDER — MOUNJARO 2.5 MG/0.5ML ~~LOC~~ SOAJ: 2.5000 mg | SUBCUTANEOUS | 2 refills | Status: AC

## 2023-07-09 NOTE — Telephone Encounter (Signed)
 Yes I will send prescription for Mounjaro .  I swear every time I think I get this stuff straight, I make another mistake.  Appreciate your help!

## 2023-07-22 ENCOUNTER — Ambulatory Visit: Admitting: Family Medicine

## 2023-07-22 DIAGNOSIS — F419 Anxiety disorder, unspecified: Secondary | ICD-10-CM

## 2023-07-23 ENCOUNTER — Encounter: Payer: Self-pay | Admitting: Family Medicine

## 2023-07-23 NOTE — Progress Notes (Signed)
 [facility] Telemedicine Visit  Patient consented to have virtual visit and was identified by name and date of birth. Method of visit: Telephone  Encounter participants: Patient: Kara Hoover - located at home Provider: Camie Mulch - located at office Others (if applicable): None  Chief Complaint: Anxiety  HPI:  Since she returned to work her anxieties starting to peak.  Her supervisor is out on maternity leave so she is having to deal with a different supervisor in evidently there are a lot of things that are different so she says she feels like she is walking on eggs did not make a mistake.  Continues take her medicine regularly.  Feels like she is really not able to focus or concentrate on her job because she is so worried that she is going to make a mistake and get negative points on her record.  ROS: per HPI  Pertinent PMHx: Diabetes mellitus  Exam:  There were no vitals taken for this visit.  Respiratory: Normal respiratory effort PSYCH: AxOx4.  Asks and answers questions appropriately.  Mood is a little bit excitable.  No hallucinations.  Speech is a bit pressured and scattered.   Assessment/Plan:  Anxiety Unfortunately she went back into a work situation that was drastically different from the when she left having a new supervisor, and this is really strong her for a loop.  I will send her some information about possible counseling resources because I think she would benefit from that.  We discussed some relaxation and focusing techniques.  I would like to follow-up with her in 2 to 3 weeks, sooner if she has new issues.  Will continue her current medications.    Time spent during visit with patient: 23 minutes

## 2023-07-23 NOTE — Assessment & Plan Note (Signed)
 Unfortunately she went back into a work situation that was drastically different from the when she left having a new supervisor, and this is really strong her for a loop.  I will send her some information about possible counseling resources because I think she would benefit from that.  We discussed some relaxation and focusing techniques.  I would like to follow-up with her in 2 to 3 weeks, sooner if she has new issues.  Will continue her current medications.

## 2023-07-24 DIAGNOSIS — F331 Major depressive disorder, recurrent, moderate: Secondary | ICD-10-CM | POA: Diagnosis not present

## 2023-08-05 ENCOUNTER — Other Ambulatory Visit: Payer: Self-pay | Admitting: Family Medicine

## 2023-08-05 ENCOUNTER — Encounter: Payer: Self-pay | Admitting: Family Medicine

## 2023-08-05 MED ORDER — VIIBRYD STARTER PACK 10 & 20 MG PO KIT: PACK | ORAL | Status: AC

## 2023-08-13 ENCOUNTER — Telehealth: Admitting: Family Medicine

## 2023-08-14 ENCOUNTER — Encounter: Payer: Self-pay | Admitting: Family Medicine

## 2023-08-14 ENCOUNTER — Telehealth: Admitting: Family Medicine

## 2023-08-14 DIAGNOSIS — F3341 Major depressive disorder, recurrent, in partial remission: Secondary | ICD-10-CM | POA: Diagnosis not present

## 2023-08-14 DIAGNOSIS — F339 Major depressive disorder, recurrent, unspecified: Secondary | ICD-10-CM

## 2023-08-14 DIAGNOSIS — I10 Essential (primary) hypertension: Secondary | ICD-10-CM | POA: Diagnosis not present

## 2023-08-14 NOTE — Assessment & Plan Note (Signed)
 I applauded her follow-up with a psychiatrist.  I think she is in great hands there.  I will be happy to follow her as well but I will let the psychiatrist manage medication.  She will let me know how she is doing the next few weeks via MyChart.

## 2023-08-14 NOTE — Progress Notes (Signed)
 [facility] Telemedicine Visit  Patient consented to have virtual visit and was identified by name and date of birth. Method of visit: Telephone  Encounter participants: Patient: Kara Hoover - located at home Provider: Camie Mulch - located at office Others (if applicable): None  Chief Complaint: Follow-up anxious depression now seeing a psychiatrist as well and has had some medication changes.  HPI:  Psychiatrist changed her to Viibryd  she is having some nausea with that but it is in the first few days of the switch.  She is hopeful that it will help.   ROS: per HPI  Pertinent PMHx: She has previously seen a psychiatrist many years ago when her son died and is very comfortable with her.  Exam:  There were no vitals taken for this visit.  Respiratory: Normal respiratory pattern noted.  Patient is interactive.  Mood is stable and upbeat.  Speech is normal in fluency and content, no pressured speech, no agitation.  Assessment/Plan:  Major depressive disorder, recurrent episode with anxious distress (HCC) I applauded her follow-up with a psychiatrist.  I think she is in great hands there.  I will be happy to follow her as well but I will let the psychiatrist manage medication.  She will let me know how she is doing the next few weeks via MyChart.  Primary hypertension Blood pressure has been under good control.  She is not having any headaches right now.  No problems with her medicine so we will continue current medication regimen.  No need for labs.    Time spent during visit with patient: 25 minutes

## 2023-08-14 NOTE — Assessment & Plan Note (Signed)
 Blood pressure has been under good control.  She is not having any headaches right now.  No problems with her medicine so we will continue current medication regimen.  No need for labs.

## 2023-09-03 ENCOUNTER — Ambulatory Visit: Admitting: Family Medicine

## 2023-09-03 ENCOUNTER — Encounter: Payer: Self-pay | Admitting: Family Medicine

## 2023-09-03 VITALS — BP 101/73 | HR 98 | Wt 165.0 lb

## 2023-09-03 DIAGNOSIS — E1165 Type 2 diabetes mellitus with hyperglycemia: Secondary | ICD-10-CM | POA: Diagnosis not present

## 2023-09-03 DIAGNOSIS — F4323 Adjustment disorder with mixed anxiety and depressed mood: Secondary | ICD-10-CM | POA: Diagnosis not present

## 2023-09-03 DIAGNOSIS — I10 Essential (primary) hypertension: Secondary | ICD-10-CM

## 2023-09-03 DIAGNOSIS — E1169 Type 2 diabetes mellitus with other specified complication: Secondary | ICD-10-CM | POA: Diagnosis not present

## 2023-09-03 DIAGNOSIS — E785 Hyperlipidemia, unspecified: Secondary | ICD-10-CM

## 2023-09-03 LAB — POCT GLYCOSYLATED HEMOGLOBIN (HGB A1C): HbA1c, POC (controlled diabetic range): 5.4 % (ref 0.0–7.0)

## 2023-09-03 NOTE — Patient Instructions (Addendum)
 I will send you a note about your labs. I have attached some exercises for you neck. I would also look at some upper body and shoulder exercises like the overhead press. You can look that up on the internet.  [email or web address]  This is one site  Let me see you back in about 4 months. Great to see you!

## 2023-09-04 NOTE — Progress Notes (Signed)
    CHIEF COMPLAINT / HPI: Follow-up diabetes mellitus: Feels like her blood sugars under pretty good control.  She is trying to follow a good diet.  Very busy with her job and taking care of her parents and not getting as much regular exercise as she would like. 2.  Hypertension: Taking her medicine regularly.  She has been having some headaches but thinks these are more likely related to muscle tension as they occur after she spent a long day at the computer. 3.  Hyperlipidemia: No problems with her cholesterol medicine she continues on that daily 4.  Seeing psychiatry for mood disorder and doing pretty well right now on Viibryd    PERTINENT  PMH / PSH: I have reviewed the patient's medications, allergies, past medical and surgical history, smoking status and updated in the EMR as appropriate.   OBJECTIVE:  BP 101/73   Pulse 98   Wt 165 lb (74.8 kg)   SpO2 100%   BMI 27.46 kg/m  Vital signs reviewed. GENERAL: Well-developed, well-nourished, no acute distress. CARDIOVASCULAR: Regular rate and rhythm no murmur gallop or rub LUNGS: Clear to auscultation bilaterally, no rales or wheeze. ABDOMEN: Soft positive bowel sounds NEURO: No gross focal neurological deficits. MSK: Movement of extremity x 4.   ASSESSMENT / PLAN: Will check some labs today for her diabetes mellitus, hypertension and hyperlipidemia.  Right now we will continue current medication until we get those lab results.  She will continue to follow-up with psychiatry regarding mood disorder.  I will see her back in 3 months.  No problem-specific Assessment & Plan notes found for this encounter.   Camie Mulch MD

## 2023-09-05 ENCOUNTER — Ambulatory Visit: Payer: Self-pay | Admitting: Family Medicine

## 2023-09-05 LAB — COMPREHENSIVE METABOLIC PANEL WITH GFR
ALT: 16 IU/L (ref 0–32)
AST: 17 IU/L (ref 0–40)
Albumin: 5.1 g/dL — ABNORMAL HIGH (ref 3.8–4.9)
Alkaline Phosphatase: 127 IU/L — ABNORMAL HIGH (ref 44–121)
BUN/Creatinine Ratio: 19 (ref 9–23)
BUN: 22 mg/dL (ref 6–24)
Bilirubin Total: 0.4 mg/dL (ref 0.0–1.2)
CO2: 22 mmol/L (ref 20–29)
Calcium: 8.8 mg/dL (ref 8.7–10.2)
Chloride: 103 mmol/L (ref 96–106)
Creatinine, Ser: 1.17 mg/dL — ABNORMAL HIGH (ref 0.57–1.00)
Globulin, Total: 2.8 g/dL (ref 1.5–4.5)
Glucose: 96 mg/dL (ref 70–99)
Potassium: 5.2 mmol/L (ref 3.5–5.2)
Sodium: 144 mmol/L (ref 134–144)
Total Protein: 7.9 g/dL (ref 6.0–8.5)
eGFR: 55 mL/min/1.73 — ABNORMAL LOW (ref >59)

## 2023-09-05 LAB — LIPID PANEL
Chol/HDL Ratio: 2.4 ratio (ref 0.0–4.4)
Cholesterol, Total: 134 mg/dL (ref 100–199)
HDL: 56 mg/dL (ref >39)
LDL Chol Calc (NIH): 64 mg/dL (ref 0–99)
Triglycerides: 69 mg/dL (ref 0–149)
VLDL Cholesterol Cal: 14 mg/dL (ref 5–40)

## 2023-09-11 ENCOUNTER — Encounter: Payer: Self-pay | Admitting: Family Medicine

## 2023-10-03 ENCOUNTER — Other Ambulatory Visit: Payer: Self-pay | Admitting: Family Medicine

## 2023-10-27 ENCOUNTER — Other Ambulatory Visit: Payer: Self-pay | Admitting: Family Medicine

## 2023-10-27 MED ORDER — VALACYCLOVIR HCL 500 MG PO TABS: ORAL_TABLET | ORAL | 3 refills | Status: AC

## 2023-10-27 NOTE — Progress Notes (Signed)
 outbreak of cold sore

## 2023-12-18 DIAGNOSIS — R1085 Abdominal pain of multiple sites: Secondary | ICD-10-CM | POA: Diagnosis not present

## 2023-12-18 DIAGNOSIS — R1032 Left lower quadrant pain: Secondary | ICD-10-CM | POA: Diagnosis not present

## 2023-12-29 ENCOUNTER — Other Ambulatory Visit: Payer: Self-pay | Admitting: Family Medicine

## 2023-12-29 ENCOUNTER — Other Ambulatory Visit: Payer: Self-pay | Admitting: Nurse Practitioner

## 2023-12-29 DIAGNOSIS — Z1231 Encounter for screening mammogram for malignant neoplasm of breast: Secondary | ICD-10-CM

## 2023-12-31 DIAGNOSIS — F3342 Major depressive disorder, recurrent, in full remission: Secondary | ICD-10-CM | POA: Diagnosis not present

## 2024-01-21 DIAGNOSIS — E119 Type 2 diabetes mellitus without complications: Secondary | ICD-10-CM | POA: Diagnosis not present

## 2024-01-21 LAB — OPHTHALMOLOGY REPORT-SCANNED

## 2024-01-27 ENCOUNTER — Ambulatory Visit
Admission: RE | Admit: 2024-01-27 | Discharge: 2024-01-27 | Disposition: A | Discharge status: Home / Self Care | Source: Ambulatory Visit | Attending: Family Medicine | Admitting: Family Medicine

## 2024-01-27 DIAGNOSIS — Z1231 Encounter for screening mammogram for malignant neoplasm of breast: Secondary | ICD-10-CM

## 2024-02-10 ENCOUNTER — Other Ambulatory Visit: Payer: Self-pay | Admitting: Family Medicine

## 2024-02-10 DIAGNOSIS — E1169 Type 2 diabetes mellitus with other specified complication: Secondary | ICD-10-CM

## 2024-02-13 ENCOUNTER — Other Ambulatory Visit: Payer: Self-pay | Admitting: Nurse Practitioner

## 2024-02-13 DIAGNOSIS — I10 Essential (primary) hypertension: Secondary | ICD-10-CM

## 2024-02-17 NOTE — Addendum Note (Signed)
 Encounter addended by: Wenceslao Sos, RT on: 02/17/2024 9:18 AM  Actions taken: Imaging Exam ended, Image imported
